# Patient Record
Sex: Female | Born: 1990 | Hispanic: No | Marital: Single | Smoking: Former smoker
Health system: Southern US, Community
[De-identification: ages and names within clinical notes are randomized; demographics above are authoritative.]

## PROBLEM LIST (undated history)

## (undated) ENCOUNTER — Inpatient Hospital Stay (HOSPITAL_COMMUNITY): Payer: Self-pay

## (undated) DIAGNOSIS — G932 Benign intracranial hypertension: Secondary | ICD-10-CM

## (undated) DIAGNOSIS — H4710 Unspecified papilledema: Secondary | ICD-10-CM

## (undated) DIAGNOSIS — F32A Depression, unspecified: Secondary | ICD-10-CM

## (undated) DIAGNOSIS — R519 Headache, unspecified: Secondary | ICD-10-CM

## (undated) DIAGNOSIS — R0600 Dyspnea, unspecified: Secondary | ICD-10-CM

## (undated) DIAGNOSIS — O24419 Gestational diabetes mellitus in pregnancy, unspecified control: Secondary | ICD-10-CM

## (undated) DIAGNOSIS — K5900 Constipation, unspecified: Secondary | ICD-10-CM

## (undated) DIAGNOSIS — R51 Headache: Secondary | ICD-10-CM

## (undated) HISTORY — DX: Unspecified papilledema: H47.10

## (undated) HISTORY — DX: Benign intracranial hypertension: G93.2

## (undated) HISTORY — PX: APPENDECTOMY: SHX54

## (undated) HISTORY — DX: Gestational diabetes mellitus in pregnancy, unspecified control: O24.419

---
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
## 2010-04-12 ENCOUNTER — Emergency Department (HOSPITAL_COMMUNITY)
Admission: EM | Admit: 2010-04-12 | Discharge: 2010-04-12 | Discharge status: Against Medical Advice | Payer: Self-pay | Attending: Emergency Medicine | Admitting: Emergency Medicine

## 2010-04-12 DIAGNOSIS — R21 Rash and other nonspecific skin eruption: Secondary | ICD-10-CM | POA: Insufficient documentation

## 2010-05-08 LAB — CBC
HCT: 28.6 % — ABNORMAL LOW (ref 36.0–46.0)
HCT: 31.4 % — ABNORMAL LOW (ref 36.0–46.0)
Hemoglobin: 10 g/dL — ABNORMAL LOW (ref 12.0–15.0)
Hemoglobin: 9.2 g/dL — ABNORMAL LOW (ref 12.0–15.0)
MCH: 23.3 pg — ABNORMAL LOW (ref 26.0–34.0)
MCH: 23.3 pg — ABNORMAL LOW (ref 26.0–34.0)
MCHC: 31.9 g/dL (ref 30.0–36.0)
MCHC: 32.1 g/dL (ref 30.0–36.0)
MCV: 72.7 fL — ABNORMAL LOW (ref 78.0–100.0)
MCV: 73 fL — ABNORMAL LOW (ref 78.0–100.0)
Platelets: 276 10*3/uL (ref 150–400)
Platelets: 304 10*3/uL (ref 150–400)
RBC: 3.94 MIL/uL (ref 3.87–5.11)
RBC: 4.3 MIL/uL (ref 3.87–5.11)
RDW: 16.1 % — ABNORMAL HIGH (ref 11.5–15.5)
RDW: 16.3 % — ABNORMAL HIGH (ref 11.5–15.5)
WBC: 12.9 10*3/uL — ABNORMAL HIGH (ref 4.0–10.5)
WBC: 16.1 10*3/uL — ABNORMAL HIGH (ref 4.0–10.5)

## 2010-05-08 LAB — RPR: RPR Ser Ql: NONREACTIVE

## 2010-05-20 LAB — URINALYSIS, ROUTINE W REFLEX MICROSCOPIC
Glucose, UA: NEGATIVE mg/dL
Protein, ur: 30 mg/dL — AB
pH: 7 (ref 5.0–8.0)

## 2010-05-20 LAB — URINE MICROSCOPIC-ADD ON

## 2010-05-29 LAB — URINE CULTURE: Colony Count: 45000

## 2010-05-29 LAB — DIFFERENTIAL
Eosinophils Relative: 2 % (ref 0–5)
Lymphocytes Relative: 26 % (ref 12–46)
Lymphs Abs: 1.9 10*3/uL (ref 0.7–4.0)
Monocytes Absolute: 0.4 10*3/uL (ref 0.1–1.0)
Neutro Abs: 4.8 10*3/uL (ref 1.7–7.7)

## 2010-05-29 LAB — URINALYSIS, ROUTINE W REFLEX MICROSCOPIC
Bilirubin Urine: NEGATIVE
Nitrite: NEGATIVE
Specific Gravity, Urine: 1.014 (ref 1.005–1.030)
pH: 6.5 (ref 5.0–8.0)

## 2010-05-29 LAB — CBC
MCHC: 31.4 g/dL (ref 30.0–36.0)
MCV: 72.3 fL — ABNORMAL LOW (ref 78.0–100.0)
Platelets: 270 10*3/uL (ref 150–400)
RDW: 17.6 % — ABNORMAL HIGH (ref 11.5–15.5)
WBC: 7.2 10*3/uL (ref 4.0–10.5)

## 2010-05-29 LAB — PREGNANCY, URINE: Preg Test, Ur: NEGATIVE

## 2010-05-29 LAB — LIPASE, BLOOD: Lipase: 22 U/L (ref 11–59)

## 2010-05-29 LAB — COMPREHENSIVE METABOLIC PANEL
AST: 20 U/L (ref 0–37)
Albumin: 3.7 g/dL (ref 3.5–5.2)
Calcium: 9 mg/dL (ref 8.4–10.5)
Creatinine, Ser: 0.76 mg/dL (ref 0.4–1.2)
GFR calc Af Amer: >60 mL/min (ref >60)

## 2010-06-03 LAB — CBC
Hemoglobin: 11.6 g/dL — ABNORMAL LOW (ref 12.0–15.0)
Hemoglobin: 9.1 g/dL — ABNORMAL LOW (ref 12.0–15.0)
MCHC: 33.4 g/dL (ref 30.0–36.0)
MCHC: 33.5 g/dL (ref 30.0–36.0)
MCHC: 33.8 g/dL (ref 30.0–36.0)
MCV: 81 fL (ref 78.0–100.0)
MCV: 81.7 fL (ref 78.0–100.0)
MCV: 81.7 fL (ref 78.0–100.0)
Platelets: 242 10*3/uL (ref 150–400)
RBC: 3.33 MIL/uL — ABNORMAL LOW (ref 3.87–5.11)
RBC: 4.47 MIL/uL (ref 3.87–5.11)
RDW: 14.1 % (ref 11.5–15.5)
RDW: 14.9 % (ref 11.5–15.5)
WBC: 14 10*3/uL — ABNORMAL HIGH (ref 4.0–10.5)

## 2010-06-03 LAB — COMPREHENSIVE METABOLIC PANEL
ALT: 14 U/L (ref 0–35)
ALT: 15 U/L (ref 0–35)
ALT: 15 U/L (ref 0–35)
AST: 22 U/L (ref 0–37)
AST: 26 U/L (ref 0–37)
Albumin: 2.2 g/dL — ABNORMAL LOW (ref 3.5–5.2)
Albumin: 2.6 g/dL — ABNORMAL LOW (ref 3.5–5.2)
Alkaline Phosphatase: 141 U/L — ABNORMAL HIGH (ref 39–117)
BUN: 5 mg/dL — ABNORMAL LOW (ref 6–23)
CO2: 21 mEq/L (ref 19–32)
CO2: 27 mEq/L (ref 19–32)
Calcium: 8.5 mg/dL (ref 8.4–10.5)
Calcium: 8.7 mg/dL (ref 8.4–10.5)
Chloride: 106 mEq/L (ref 96–112)
Chloride: 106 mEq/L (ref 96–112)
Creatinine, Ser: 0.66 mg/dL (ref 0.4–1.2)
Creatinine, Ser: 0.75 mg/dL (ref 0.4–1.2)
GFR calc Af Amer: >60 mL/min (ref >60)
GFR calc Af Amer: >60 mL/min (ref >60)
GFR calc non Af Amer: >60 mL/min (ref >60)
Glucose, Bld: 90 mg/dL (ref 70–99)
Glucose, Bld: 95 mg/dL (ref 70–99)
Potassium: 4.1 mEq/L (ref 3.5–5.1)
Sodium: 136 mEq/L (ref 135–145)
Sodium: 136 mEq/L (ref 135–145)
Total Bilirubin: 0.3 mg/dL (ref 0.3–1.2)
Total Bilirubin: 0.4 mg/dL (ref 0.3–1.2)
Total Protein: 6.2 g/dL (ref 6.0–8.3)

## 2010-06-03 LAB — RPR: RPR Ser Ql: NONREACTIVE

## 2010-06-03 LAB — LACTATE DEHYDROGENASE: LDH: 186 U/L (ref 94–250)

## 2010-06-04 LAB — LACTATE DEHYDROGENASE: LDH: 169 U/L (ref 94–250)

## 2010-06-04 LAB — COMPREHENSIVE METABOLIC PANEL
CO2: 24 mEq/L (ref 19–32)
Calcium: 8.9 mg/dL (ref 8.4–10.5)
Creatinine, Ser: 0.7 mg/dL (ref 0.4–1.2)
GFR calc Af Amer: >60 mL/min (ref >60)
GFR calc non Af Amer: >60 mL/min (ref >60)
Glucose, Bld: 73 mg/dL (ref 70–99)
Sodium: 136 mEq/L (ref 135–145)
Total Protein: 5.3 g/dL — ABNORMAL LOW (ref 6.0–8.3)

## 2010-06-04 LAB — CBC
Hemoglobin: 11.5 g/dL — ABNORMAL LOW (ref 12.0–15.0)
MCHC: 33.4 g/dL (ref 30.0–36.0)
RBC: 4.21 MIL/uL (ref 3.87–5.11)
RDW: 14.8 % (ref 11.5–15.5)

## 2010-06-04 LAB — URIC ACID: Uric Acid, Serum: 6.1 mg/dL (ref 2.4–7.0)

## 2010-07-09 ENCOUNTER — Emergency Department (HOSPITAL_COMMUNITY)
Admission: EM | Admit: 2010-07-09 | Discharge: 2010-07-09 | Disposition: A | Discharge status: Home / Self Care | Payer: Medicaid Other | Attending: Emergency Medicine | Admitting: Emergency Medicine

## 2010-07-09 DIAGNOSIS — N390 Urinary tract infection, site not specified: Secondary | ICD-10-CM | POA: Insufficient documentation

## 2010-07-09 DIAGNOSIS — R112 Nausea with vomiting, unspecified: Secondary | ICD-10-CM | POA: Insufficient documentation

## 2010-07-09 DIAGNOSIS — R109 Unspecified abdominal pain: Secondary | ICD-10-CM | POA: Insufficient documentation

## 2010-07-09 LAB — COMPREHENSIVE METABOLIC PANEL
ALT: 10 U/L (ref 0–35)
BUN: 8 mg/dL (ref 6–23)
CO2: 25 mEq/L (ref 19–32)
Calcium: 8.5 mg/dL (ref 8.4–10.5)
Creatinine, Ser: 0.75 mg/dL (ref 0.4–1.2)
GFR calc non Af Amer: >60 mL/min (ref >60)
Glucose, Bld: 103 mg/dL — ABNORMAL HIGH (ref 70–99)
Total Protein: 7.7 g/dL (ref 6.0–8.3)

## 2010-07-09 LAB — URINE MICROSCOPIC-ADD ON

## 2010-07-09 LAB — URINALYSIS, ROUTINE W REFLEX MICROSCOPIC
Glucose, UA: NEGATIVE mg/dL
Ketones, ur: NEGATIVE mg/dL
Nitrite: POSITIVE — AB
Protein, ur: 100 mg/dL — AB
pH: 6 (ref 5.0–8.0)

## 2010-07-09 LAB — DIFFERENTIAL
Eosinophils Relative: 0 % (ref 0–5)
Lymphs Abs: 2 10*3/uL (ref 0.7–4.0)
Monocytes Absolute: 1.5 10*3/uL — ABNORMAL HIGH (ref 0.1–1.0)
Monocytes Relative: 10 % (ref 3–12)
Neutro Abs: 11.1 10*3/uL — ABNORMAL HIGH (ref 1.7–7.7)

## 2010-07-09 LAB — CBC
HCT: 34.2 % — ABNORMAL LOW (ref 36.0–46.0)
Hemoglobin: 10.4 g/dL — ABNORMAL LOW (ref 12.0–15.0)
MCH: 21.6 pg — ABNORMAL LOW (ref 26.0–34.0)
MCHC: 30.4 g/dL (ref 30.0–36.0)
MCV: 71 fL — ABNORMAL LOW (ref 78.0–100.0)
RDW: 17.2 % — ABNORMAL HIGH (ref 11.5–15.5)

## 2010-07-09 LAB — LIPASE, BLOOD: Lipase: 17 U/L (ref 11–59)

## 2010-07-09 LAB — POCT PREGNANCY, URINE: Preg Test, Ur: NEGATIVE

## 2010-07-10 NOTE — Discharge Summary (Signed)
Jessica Massey, Jessica Massey NO.:  [id number]   MEDICAL RECORD NO.:  [id number]          PATIENT TYPE:  INP   LOCATION:  9113                          FACILITY:  [facility]   PHYSICIAN:  Zenaida Niece, M.D.DATE OF BIRTH:  1991-01-19   DATE OF ADMISSION:  09/08/2008  DATE OF DISCHARGE:  09/12/2008                               DISCHARGE SUMMARY   ADMISSION DIAGNOSIS:  Intrauterine pregnancy at 38+ weeks with  proteinuria and probable preeclampsia.   DISCHARGE DIAGNOSIS:  Intrauterine pregnancy at 38+ weeks with  proteinuria and probable preeclampsia.   PROCEDURE:  On September 10, 2008, she had a spontaneous vaginal delivery.   HISTORY AND PHYSICAL:  Please see chart for full history and physical.  Briefly, this is a 20 year old, gravida 1, para 0, with an EGA of 38+  weeks, who is admitted for proteinuria.  At her 37-week visit, she had 2  plus protein on a dipstick.  PIH labs were normal.  A 24-hour urine was  collected and returned with 2600 mg of protein.  Blood pressures have  remained normal.  This was discussed with Maternal Fetal Medicine by Dr.  Senaida Ores, and they elected to admit her for observation and deliver  her at 39 weeks.  The remainder of her history again is in her dictated  history and physical.  Physical exam is significant for obesity with  weight of 338 pounds.  Cervix is favorable with a vertex presentation.   HOSPITAL COURSE:  The patient was admitted and put on bedrest.  PIH labs  were normal.  On September 09, 2008, hospital day #2, blood pressures were  slightly elevated at 140 over 80-90.  PIH labs remained stable.  Early  on the morning of September 10, 2008, the patient complained of contractions  and possible rupture of membranes.  She was transferred to Labor and  Delivery where she rapidly progressed to complete and pushed well.  She  had IV penicillin running for group B strep at the time of delivery.  Blood pressures were 140s-150s over 80s-90s  while pushing.  She had a  vaginal delivery of a viable female infant with Apgars of 8 and 9, weight  of 7 pounds 12 ounces.  She had some small vaginal and labial  lacerations repaired with 3-0 Vicryl with local block.  Estimated blood  loss was 600 mL.  Blood pressure after delivery was 118/80.  She did not  receive magnesium sulfate.  Postpartum, she had no significant  complications.  Blood pressures remained normal.  Predelivery hemoglobin  11.5, postdelivery 9.1.  On postpartum #2, she was felt to be stable  enough for discharge home.   DISCHARGE INSTRUCTIONS:  Regular diet, pelvic rest, followup in 6 weeks.  Medications are over-the-counter ibuprofen as needed, and she is given  our discharge pamphlet.      Zenaida Niece, M.D.  Electronically Signed     TDM/MEDQ  D:  09/12/2008  T:  09/12/2008  Job:  [id number]

## 2010-07-10 NOTE — H&P (Signed)
NAME:  Jessica Massey, DEKAY NO.:  [id number]   MEDICAL RECORD NO.:  [id number]          PATIENT TYPE:  INP   LOCATION:  9149                          FACILITY:  [facility]   PHYSICIAN:  Huel Cote, M.D. DATE OF BIRTH:  August 21, 1990   DATE OF ADMISSION:  09/08/2008  DATE OF DISCHARGE:                              HISTORY & PHYSICAL   Patient is a 20 year old G1 P0 who is coming in at 38-3/[redacted] weeks  gestation with an ongoing finding of increasing proteinuria consistent  with preeclampsia.  On her 37 week visit the patient was noted to have  2+ proteinuria, which was confirmed by urine catheterization, therefore  she had pre-eclamptic labs performed which were normal and a 24-hour  urine performed.  The 24-hour urine returned abnormal with a total  protein of 2600 mg.  However, the patient's blood pressure and labs  remained normal.  Therefore, we continued with close observation.  On  the 15th, she presented to the office with an increase to 4+  proteinuria, her blood pressure was 135/80.  She had 3+ edema, otherwise  had no pre-eclamptic symptoms, no headaches or epigastric pain and  really felt overall pretty well.  She had good fetal movement and no  other complaints.  The patient was discussed with MSM which felt that  the proteinuria did not necessarily warrant delivery until 39 weeks,  however, she should be admitted inpatient for close observation of blood  pressures and labs.  Therefore, she is going to be admitted to the  antepartum unit with plan for daily PIH labs as well as a monitoring  with nonstress test daily.  Should she have any other abnormalities it  is recommended that the patient be delivered and if no other  abnormalities present certainly delivery at 39 weeks is acceptable.   PRENATAL CARE:  Significant for teen pregnancy.  She also has a history  of ADHD.   PRENATAL LABS:  Are as follows:  A+, antibody negative, RPR nonreactive,  rubella is  equivocal, needs postpartum vaccination, hepatitis B surface  antigen negative, HIV negative, GC negative, Chlamydia negative.  She is  positive for group B strep.  The patient had first trimester screening  which was normal and a normal AFP.  Her ultrasound was performed twice  to complete anatomy given her obesity and showed no abnormalities.  One-  hour Glucola was 121.   PAST OBSTETRICAL HISTORY:  None.   PAST GYN HISTORY:  None.   PAST MEDICAL HISTORY:  History of ADHD without medications required.   PAST SURGICAL HISTORY:  Appendectomy performed at age 51.   She has no known drug allergies.   Socially, the patient is a teenager.  She is obese, with a BMI of  greater than 40.  She did smoke prior to pregnancy, however has quit  with her pregnancy.   PHYSICAL EXAM:  Weight is 338 pounds, this is up 3 pounds from her visit  3 days ago.  Her blood pressure was 135/80.  CARDIAC:  Regular rate and rhythm.  LUNGS:  Clear.  ABDOMEN:  Obese and nontender.  Her edema is significant at 3+  bilaterally.  CERVICAL:  Is 54, -2 and vertex.   Again, this is a 20 year old patient with developing preeclampsia mostly  manifested by severe proteinuria and no abnormalities in labs thus far.  We will admit her to the antepartum unit for repeat daily laboratories  as well as blood pressure checks and should the patient develop any  symptoms or abnormal blood pressures proceed with induction of labor.  Should she remain stable until that point, she will be induced on July  19th at 39 weeks' gestation.      Huel Cote, M.D.  Electronically Signed     KR/MEDQ  D:  09/08/2008  T:  09/08/2008  Job:  [id number]

## 2010-12-27 ENCOUNTER — Emergency Department (HOSPITAL_COMMUNITY): Payer: Self-pay

## 2010-12-27 ENCOUNTER — Emergency Department (HOSPITAL_COMMUNITY)
Admission: EM | Admit: 2010-12-27 | Discharge: 2010-12-27 | Disposition: A | Discharge status: Home / Self Care | Payer: Self-pay | Attending: Emergency Medicine | Admitting: Emergency Medicine

## 2010-12-27 DIAGNOSIS — W268XXA Contact with other sharp object(s), not elsewhere classified, initial encounter: Secondary | ICD-10-CM | POA: Insufficient documentation

## 2010-12-27 DIAGNOSIS — W01119A Fall on same level from slipping, tripping and stumbling with subsequent striking against unspecified sharp object, initial encounter: Secondary | ICD-10-CM | POA: Insufficient documentation

## 2010-12-27 DIAGNOSIS — T148XXA Other injury of unspecified body region, initial encounter: Secondary | ICD-10-CM | POA: Insufficient documentation

## 2010-12-27 DIAGNOSIS — Y92009 Unspecified place in unspecified non-institutional (private) residence as the place of occurrence of the external cause: Secondary | ICD-10-CM | POA: Insufficient documentation

## 2010-12-30 ENCOUNTER — Encounter: Payer: Self-pay | Admitting: *Deleted

## 2010-12-30 ENCOUNTER — Emergency Department (HOSPITAL_COMMUNITY)
Admission: EM | Admit: 2010-12-30 | Discharge: 2010-12-30 | Disposition: A | Discharge status: Home / Self Care | Payer: Self-pay | Attending: Emergency Medicine | Admitting: Emergency Medicine

## 2010-12-30 DIAGNOSIS — W268XXA Contact with other sharp object(s), not elsewhere classified, initial encounter: Secondary | ICD-10-CM | POA: Insufficient documentation

## 2010-12-30 DIAGNOSIS — W01119A Fall on same level from slipping, tripping and stumbling with subsequent striking against unspecified sharp object, initial encounter: Secondary | ICD-10-CM | POA: Insufficient documentation

## 2010-12-30 DIAGNOSIS — IMO0001 Reserved for inherently not codable concepts without codable children: Secondary | ICD-10-CM

## 2010-12-30 DIAGNOSIS — S20229A Contusion of unspecified back wall of thorax, initial encounter: Secondary | ICD-10-CM | POA: Insufficient documentation

## 2010-12-30 DIAGNOSIS — Z4800 Encounter for change or removal of nonsurgical wound dressing: Secondary | ICD-10-CM | POA: Insufficient documentation

## 2010-12-30 NOTE — ED Provider Notes (Signed)
Medical screening examination/treatment/procedure(s) were conducted as a shared visit with non-physician practitioner(s) and myself.  I personally evaluated the patient during the encounter In patient with to the stapled surgical wounds on her back. No surrounding redness swelling or tenderness. No signs of infection  Doug Sou, MD 12/30/10 [phone]

## 2010-12-30 NOTE — ED Provider Notes (Signed)
History     CSN: [id number] Arrival date & time: 12/30/2010 11:18 AM   First MD Initiated Contact with Patient 12/30/10 1206      Chief Complaint  Patient presents with  . Wound Infection    (Consider location/radiation/quality/duration/timing/severity/associated sxs/prior treatment) HPI The patient is here for reevaluation of a wound in which she had staples placed last Thursday in November 1.  She was concerned that there may be infection involving the wound such want to be reassessed.  Patient has no fevers, and no drainage from the wound. History reviewed. No pertinent past medical history.  Past Surgical History  Procedure Date  . Appendectomy     No family history on file.  History  Substance Use Topics  . Smoking status: Current Everyday Smoker  . Smokeless tobacco: Not on file  . Alcohol Use: No    OB History    Grav Para Term Preterm Abortions TAB SAB Ect Mult Living                  Review of Systems  Constitutional: Negative for fever.  All other systems reviewed and are negative.    Allergies  Review of patient's allergies indicates no known allergies.  Home Medications   Current Outpatient Rx  Name Route Sig Dispense Refill  . HYDROCODONE-ACETAMINOPHEN 5-325 MG PO TABS Oral Take 1 tablet by mouth every 4 (four) hours as needed. For pain.       BP 109/63  Pulse 79  Temp(Src) 98.7 F (37.1 C) (Oral)  SpO2 99%  Physical Exam  Constitutional: She appears well-developed and well-nourished.  HENT:  Head: Normocephalic and atraumatic.  Eyes: Pupils are equal, round, and reactive to light.  Cardiovascular: Normal rate and regular rhythm.   Pulmonary/Chest: Effort normal and breath sounds normal.  Skin: Skin is warm and dry. Ecchymosis noted. No erythema.       ED Course  Procedures (including critical care time)  Labs Reviewed - No data to display No results found.   No diagnosis found.    MDM  Patient has no signs of infection  on exam, and Dr. Rennis Chris, also evaluated the patient agrees with this assessment.  The patient had sutures out at the assigned time.  Also, return here as needed        Carlyle Dolly, [place] 12/30/10 1259

## 2010-12-30 NOTE — ED Notes (Signed)
Pt fell through glass table on the 1st of Nov, now has redness mid lower  Staple line, upper back without s/s of infection

## 2011-01-06 ENCOUNTER — Emergency Department (HOSPITAL_COMMUNITY)
Admission: EM | Admit: 2011-01-06 | Discharge: 2011-01-06 | Disposition: A | Discharge status: Home / Self Care | Payer: Self-pay | Attending: Emergency Medicine | Admitting: Emergency Medicine

## 2011-01-06 ENCOUNTER — Encounter (HOSPITAL_COMMUNITY): Payer: Self-pay

## 2011-01-06 DIAGNOSIS — Z4802 Encounter for removal of sutures: Secondary | ICD-10-CM | POA: Insufficient documentation

## 2011-01-06 DIAGNOSIS — F17200 Nicotine dependence, unspecified, uncomplicated: Secondary | ICD-10-CM | POA: Insufficient documentation

## 2011-01-06 NOTE — ED Provider Notes (Signed)
History     CSN: [id number] Arrival date & time: 01/06/2011 10:40 AM   First MD Initiated Contact with Patient 01/06/11 1050      Chief Complaint  Patient presents with  . Suture / Staple Removal    was told last week to come in to get staples removed out of back/ follow-up tx    (Consider location/radiation/quality/duration/timing/severity/associated sxs/prior treatment) Patient is a 20 y.o. female presenting with suture removal. The history is provided by the patient.  Suture / Staple Removal   pt cut back accidentally on glass table on 11/1. Had xrays. Tetanus utd. States here for staple removal. No problems with wound. No fever. No purulent drainage.   History reviewed. No pertinent past medical history.  Past Surgical History  Procedure Date  . Appendectomy     No family history on file.  History  Substance Use Topics  . Smoking status: Current Everyday Smoker  . Smokeless tobacco: Not on file  . Alcohol Use: No    OB History    Grav Para Term Preterm Abortions TAB SAB Ect Mult Living                  Review of Systems  Constitutional: Negative for fever.  Gastrointestinal: Negative for nausea and vomiting.  Musculoskeletal: Negative for back pain.  Skin: Negative for rash.    Allergies  Review of patient's allergies indicates no known allergies.  Home Medications   Current Outpatient Rx  Name Route Sig Dispense Refill  . HYDROCODONE-ACETAMINOPHEN 5-325 MG PO TABS Oral Take 1 tablet by mouth every 4 (four) hours as needed. For pain.       BP 125/68  Pulse 81  Temp(Src) 98.2 F (36.8 C) (Oral)  Resp 20  SpO2 99%  LMP 12/31/2010  Physical Exam  Nursing note and vitals reviewed. Constitutional: She appears well-developed and well-nourished. No distress.  Eyes: Conjunctivae are normal. No scleral icterus.  Neck: Neck supple. No tracheal deviation present.  Cardiovascular: Normal rate.   Pulmonary/Chest: Effort normal. No respiratory distress.   Abdominal: Normal appearance. She exhibits no distension.  Musculoskeletal: She exhibits no edema.  Neurological: She is alert.  Skin: Skin is warm and dry. No rash noted.       Healing wounds to mid and lower back, without cellulitis, purulent drainage or other sign acute infection.  Psychiatric: She has a normal mood and affect.    ED Course  Procedures (including critical care time)  Labs Reviewed - No data to display No results found.   No diagnosis found.    MDM  Reviewed nursing note and chart. Nursing staff to remove staples and apply steri strips and dressing.        Suzi Roots, MD 01/06/11 1125

## 2011-01-10 ENCOUNTER — Encounter (HOSPITAL_COMMUNITY): Payer: Self-pay | Admitting: Emergency Medicine

## 2011-01-10 ENCOUNTER — Emergency Department (HOSPITAL_COMMUNITY)
Admission: EM | Admit: 2011-01-10 | Discharge: 2011-01-10 | Disposition: A | Discharge status: Home / Self Care | Payer: Medicaid Other | Attending: Emergency Medicine | Admitting: Emergency Medicine

## 2011-01-10 DIAGNOSIS — Z4802 Encounter for removal of sutures: Secondary | ICD-10-CM | POA: Insufficient documentation

## 2011-01-10 NOTE — ED Provider Notes (Signed)
History     CSN: [id number] Arrival date & time: 01/10/2011  5:06 PM   First MD Initiated Contact with Patient 01/10/11 1707      Chief Complaint  Patient presents with  . Suture / Staple Removal    Had most most her staples remained on 11/11 but missed one.    (Consider location/radiation/quality/duration/timing/severity/associated sxs/prior treatment) Patient is a 20 y.o. female presenting with suture removal. The history is provided by the patient.  Suture / Staple Removal  Treatments tried: The patient returned to ED after initial treatment for staple removal 01-06-11. There was one staple left in along with applied steristrips on that visit and she is here for final staple remove. There has been no drainage from the wound. There is no redness present. There is no swelling present. The pain has improved.    History reviewed. No pertinent past medical history.  Past Surgical History  Procedure Date  . Appendectomy     No family history on file.  History  Substance Use Topics  . Smoking status: Current Everyday Smoker  . Smokeless tobacco: Not on file  . Alcohol Use: No    OB History    Grav Para Term Preterm Abortions TAB SAB Ect Mult Living                  Review of Systems  Constitutional: Negative for fever and chills.  HENT: Negative.   Respiratory: Negative.   Cardiovascular: Negative.   Gastrointestinal: Negative.   Musculoskeletal: Negative.   Skin:       See HPI.  Neurological: Negative.     Allergies  Review of patient's allergies indicates no known allergies.  Home Medications   Current Outpatient Rx  Name Route Sig Dispense Refill  . ACETAMINOPHEN 325 MG PO TABS Oral Take 650 mg by mouth every 6 (six) hours as needed. For pain    . HYDROCORTISONE 1 % EX CREA Topical Apply 1 application topically 2 (two) times daily as needed. eczema    . IBUPROFEN 200 MG PO TABS Oral Take 400-600 mg by mouth every 6 (six) hours as needed. For pain        BP 117/81  Pulse 84  Temp 98.3 F (36.8 C)  Resp 14  SpO2 100%  LMP 12/31/2010  Physical Exam  Constitutional: She is oriented to person, place, and time. She appears well-developed and well-nourished.  Neck: Normal range of motion.  Pulmonary/Chest: Effort normal.  Neurological: She is alert and oriented to person, place, and time.  Skin: Skin is warm and dry.       Healing laceration to lower back with scab in place and one staple on right border. No redness, drainage. Mild tenderness.    ED Course  Procedures (including critical care time)  Labs Reviewed - No data to display No results found.   No diagnosis found.    MDM  One staple removed. Steristrips again placed for reinforcement of wound healing.        Rodena Medin, PA 01/10/11 [phone]

## 2011-01-11 NOTE — ED Provider Notes (Signed)
Medical screening examination/treatment/procedure(s) were performed by non-physician practitioner and as supervising physician I was immediately available for consultation/collaboration.   Lathaniel Legate, MD 01/11/11 0031 

## 2013-11-23 ENCOUNTER — Emergency Department (HOSPITAL_COMMUNITY)
Admission: EM | Admit: 2013-11-23 | Discharge: 2013-11-24 | Disposition: A | Discharge status: Home / Self Care | Payer: BC Managed Care – PPO | Attending: Emergency Medicine | Admitting: Emergency Medicine

## 2013-11-23 ENCOUNTER — Emergency Department (HOSPITAL_COMMUNITY): Payer: BC Managed Care – PPO

## 2013-11-23 ENCOUNTER — Encounter (HOSPITAL_COMMUNITY): Payer: Self-pay | Admitting: Emergency Medicine

## 2013-11-23 DIAGNOSIS — N898 Other specified noninflammatory disorders of vagina: Secondary | ICD-10-CM | POA: Insufficient documentation

## 2013-11-23 DIAGNOSIS — B9689 Other specified bacterial agents as the cause of diseases classified elsewhere: Secondary | ICD-10-CM | POA: Insufficient documentation

## 2013-11-23 DIAGNOSIS — F17200 Nicotine dependence, unspecified, uncomplicated: Secondary | ICD-10-CM | POA: Diagnosis not present

## 2013-11-23 DIAGNOSIS — A499 Bacterial infection, unspecified: Secondary | ICD-10-CM | POA: Diagnosis not present

## 2013-11-23 DIAGNOSIS — R0602 Shortness of breath: Secondary | ICD-10-CM | POA: Diagnosis not present

## 2013-11-23 DIAGNOSIS — N760 Acute vaginitis: Secondary | ICD-10-CM | POA: Diagnosis not present

## 2013-11-23 DIAGNOSIS — Z3202 Encounter for pregnancy test, result negative: Secondary | ICD-10-CM | POA: Diagnosis not present

## 2013-11-23 DIAGNOSIS — R209 Unspecified disturbances of skin sensation: Secondary | ICD-10-CM | POA: Insufficient documentation

## 2013-11-23 DIAGNOSIS — F959 Tic disorder, unspecified: Secondary | ICD-10-CM | POA: Insufficient documentation

## 2013-11-23 DIAGNOSIS — R4789 Other speech disturbances: Secondary | ICD-10-CM | POA: Diagnosis not present

## 2013-11-23 DIAGNOSIS — R4182 Altered mental status, unspecified: Secondary | ICD-10-CM | POA: Insufficient documentation

## 2013-11-23 LAB — URINALYSIS, ROUTINE W REFLEX MICROSCOPIC
Bilirubin Urine: NEGATIVE
Glucose, UA: NEGATIVE mg/dL
HGB URINE DIPSTICK: NEGATIVE
Ketones, ur: NEGATIVE mg/dL
Nitrite: NEGATIVE
PROTEIN: NEGATIVE mg/dL
SPECIFIC GRAVITY, URINE: 1.011 (ref 1.005–1.030)
Urobilinogen, UA: 0.2 mg/dL (ref 0.0–1.0)
pH: 8 (ref 5.0–8.0)

## 2013-11-23 LAB — COMPREHENSIVE METABOLIC PANEL
ALT: 15 U/L (ref 0–35)
ANION GAP: 14 (ref 5–15)
AST: 21 U/L (ref 0–37)
Albumin: 4.1 g/dL (ref 3.5–5.2)
Alkaline Phosphatase: 75 U/L (ref 39–117)
BUN: 15 mg/dL (ref 6–23)
CALCIUM: 9.2 mg/dL (ref 8.4–10.5)
CO2: 22 mEq/L (ref 19–32)
CREATININE: 0.66 mg/dL (ref 0.50–1.10)
Chloride: 102 mEq/L (ref 96–112)
GFR calc non Af Amer: >90 mL/min (ref >90)
GLUCOSE: 75 mg/dL (ref 70–99)
Potassium: 4.3 mEq/L (ref 3.7–5.3)
SODIUM: 138 meq/L (ref 137–147)
TOTAL PROTEIN: 8.2 g/dL (ref 6.0–8.3)
Total Bilirubin: 0.2 mg/dL — ABNORMAL LOW (ref 0.3–1.2)

## 2013-11-23 LAB — CBC WITH DIFFERENTIAL/PLATELET
Basophils Absolute: 0.1 10*3/uL (ref 0.0–0.1)
Basophils Relative: 0 % (ref 0–1)
EOS ABS: 0.1 10*3/uL (ref 0.0–0.7)
EOS PCT: 1 % (ref 0–5)
HEMATOCRIT: 43 % (ref 36.0–46.0)
Hemoglobin: 13.7 g/dL (ref 12.0–15.0)
LYMPHS ABS: 2.6 10*3/uL (ref 0.7–4.0)
Lymphocytes Relative: 20 % (ref 12–46)
MCH: 25 pg — AB (ref 26.0–34.0)
MCHC: 31.9 g/dL (ref 30.0–36.0)
MCV: 78.6 fL (ref 78.0–100.0)
MONO ABS: 1 10*3/uL (ref 0.1–1.0)
Monocytes Relative: 7 % (ref 3–12)
Neutro Abs: 9.6 10*3/uL — ABNORMAL HIGH (ref 1.7–7.7)
Neutrophils Relative %: 72 % (ref 43–77)
PLATELETS: 235 10*3/uL (ref 150–400)
RBC: 5.47 MIL/uL — AB (ref 3.87–5.11)
RDW: 15.1 % (ref 11.5–15.5)
WBC: 13.4 10*3/uL — ABNORMAL HIGH (ref 4.0–10.5)

## 2013-11-23 LAB — RAPID URINE DRUG SCREEN, HOSP PERFORMED
AMPHETAMINES: NOT DETECTED
BARBITURATES: NOT DETECTED
BENZODIAZEPINES: NOT DETECTED
Cocaine: NOT DETECTED
Opiates: NOT DETECTED
TETRAHYDROCANNABINOL: NOT DETECTED

## 2013-11-23 LAB — URINE MICROSCOPIC-ADD ON

## 2013-11-23 LAB — WET PREP, GENITAL
TRICH WET PREP: NONE SEEN
YEAST WET PREP: NONE SEEN

## 2013-11-23 LAB — PREGNANCY, URINE: Preg Test, Ur: NEGATIVE

## 2013-11-23 LAB — ETHANOL

## 2013-11-23 MED ORDER — LORAZEPAM 2 MG/ML IJ SOLN
1.0000 mg | Freq: Once | INTRAMUSCULAR | Status: AC
  Administered 2013-11-23: 1 mg via INTRAVENOUS
  Filled 2013-11-23: qty 1

## 2013-11-23 MED ORDER — METRONIDAZOLE 500 MG PO TABS: 500.0000 mg | ORAL_TABLET | Freq: Two times a day (BID) | ORAL | Status: DC

## 2013-11-23 NOTE — ED Notes (Signed)
Pt currently in MRI

## 2013-11-23 NOTE — ED Provider Notes (Signed)
CSN: [id number]     Arrival date & time 11/23/13  1433 History   First MD Initiated Contact with Patient 11/23/13 1505     Chief Complaint  Patient presents with  . Altered Mental Status   Patient is a 23 y.o. female presenting with altered mental status.  Altered Mental Status Associated symptoms: no abdominal pain, no fever, no headaches, no nausea, no palpitations, no vomiting and no weakness     Patient is a 23 y.o. Female who presents to the ED with abnormal movements of the face, left arm, and bilateral legs.   Patient states that she was feeling fine and was normal and around 10:30-11:00 am started developing these abnormal movements.  Patient denies a previous history of abnormal movements or any neurological conditions.  Patient denies taking any medications or drugs.  Patient was supposed to go to the health department today for vaginal discharge which has been going on for a week and she states is a thick orange yellow.  Patient denies fever, chills, nausea, vomiting, chest pain, abdominal pain, diarrhea, constipation, urinary urgency, urinary frequency, dysuria, hematuria, vaginal pain, vaginal bleeding, headache.  Patient does admit to some numbness of her left side.    History reviewed. No pertinent past medical history. Past Surgical History  Procedure Laterality Date  . Appendectomy     History reviewed. No pertinent family history. History  Substance Use Topics  . Smoking status: Current Every Day Smoker  . Smokeless tobacco: Not on file  . Alcohol Use: No   OB History   Grav Para Term Preterm Abortions TAB SAB Ect Mult Living                 Review of Systems  Constitutional: Negative for fever, chills and fatigue.  Respiratory: Positive for shortness of breath. Negative for chest tightness.   Cardiovascular: Negative for chest pain and palpitations.  Gastrointestinal: Negative for nausea, vomiting, abdominal pain, diarrhea, constipation and blood in stool.   Genitourinary: Positive for vaginal discharge. Negative for dysuria, urgency, frequency, hematuria, flank pain, vaginal bleeding, difficulty urinating, vaginal pain and pelvic pain.  Neurological: Positive for speech difficulty and numbness. Negative for dizziness, tremors, weakness and headaches.  All other systems reviewed and are negative.     Allergies  Review of patient's allergies indicates no known allergies.  Home Medications   Prior to Admission medications   Medication Sig Start Date End Date Taking? Authorizing Provider  metroNIDAZOLE (FLAGYL) 500 MG tablet Take 1 tablet (500 mg total) by mouth 2 (two) times daily. 11/23/13   Abdon Petrosky A Forcucci, PA-C   BP 113/76  Pulse 87  Temp(Src) 98.6 F (37 C) (Oral)  Resp 15  Ht 5\' 3"  (1.6 m)  Wt 299 lb 6.4 oz (135.807 kg)  BMI 53.05 kg/m2  SpO2 90%  LMP 10/30/2013 Physical Exam  Nursing note and vitals reviewed. Constitutional: She is oriented to person, place, and time. She appears well-developed and well-nourished. No distress.  Patient constantly moving legs  HENT:  Head: Normocephalic and atraumatic.  Mouth/Throat: Oropharynx is clear and moist. No oropharyngeal exudate.  Patient has repetitive facial grimacing and tongue rolling  Eyes: Conjunctivae and EOM are normal. Pupils are equal, round, and reactive to light. No scleral icterus.  Neck: Normal range of motion. Neck supple. No JVD present. No thyromegaly present.  Cardiovascular: Normal rate, normal heart sounds and intact distal pulses.  Exam reveals no gallop and no friction rub.   No murmur heard. Pulmonary/Chest: Effort  normal and breath sounds normal. No respiratory distress. She has no wheezes. She has no rales. She exhibits no tenderness.  Abdominal: Soft. Bowel sounds are normal. She exhibits no distension and no mass. There is no tenderness. There is no rebound and no guarding.  Genitourinary: Uterus normal. No labial fusion. There is no rash, tenderness,  lesion or injury on the right labia. There is no rash, tenderness, lesion or injury on the left labia. Uterus is not deviated, not enlarged, not fixed and not tender. Cervix exhibits no motion tenderness, no discharge and no friability. Right adnexum displays no mass, no tenderness and no fullness. Left adnexum displays no mass, no tenderness and no fullness. No erythema, tenderness or bleeding around the vagina. No foreign body around the vagina. No signs of injury around the vagina. Vaginal discharge found.  IUD strings visible, there is thin white vaginal discharge with foul smell.  Lymphadenopathy:    She has no cervical adenopathy.  Neurological: She is alert and oriented to person, place, and time. She has normal strength. A sensory deficit (Mild left arm and left facial sensory deficit to light touch reported.) is present. No cranial nerve deficit. Coordination and gait normal.  Skin: Skin is warm and dry. She is not diaphoretic.  Psychiatric: Judgment and thought content normal. Her affect is blunt. She is slowed. Cognition and memory are normal.  Speech is slowed but clear    ED Course  Procedures (including critical care time) Labs Review Labs Reviewed  WET PREP, GENITAL - Abnormal; Notable for the following:    Clue Cells Wet Prep HPF POC MANY (*)    WBC, Wet Prep HPF POC MANY (*)    All other components within normal limits  CBC WITH DIFFERENTIAL - Abnormal; Notable for the following:    WBC 13.4 (*)    RBC 5.47 (*)    MCH 25.0 (*)    Neutro Abs 9.6 (*)    All other components within normal limits  COMPREHENSIVE METABOLIC PANEL - Abnormal; Notable for the following:    Total Bilirubin 0.2 (*)    All other components within normal limits  URINALYSIS, ROUTINE W REFLEX MICROSCOPIC - Abnormal; Notable for the following:    APPearance TURBID (*)    Leukocytes, UA LARGE (*)    All other components within normal limits  URINE MICROSCOPIC-ADD ON - Abnormal; Notable for the  following:    Squamous Epithelial / LPF MANY (*)    Bacteria, UA FEW (*)    All other components within normal limits  GC/CHLAMYDIA PROBE AMP  URINE RAPID DRUG SCREEN (HOSP PERFORMED)  ETHANOL  PREGNANCY, URINE  HIV ANTIBODY (ROUTINE TESTING)    Imaging Review Ct Head Wo Contrast  11/23/2013   CLINICAL DATA:  Mental status change with speech difficulty  EXAM: CT HEAD WITHOUT CONTRAST  TECHNIQUE: Contiguous axial images were obtained from the base of the skull through the vertex without intravenous contrast.  COMPARISON:  None.  FINDINGS: The ventricles are normal in size and position. There is no intracranial hemorrhage nor intracranial mass effect. There is no evidence of acute ischemic change. There is subtle decreased density in the deep white matter of the posterior parietal lobes bilaterally slightly greater on the right than on the left but this is not clearly pathologic. The cerebellum and brainstem are normal.  The observed paranasal sinuses and mastoid air cells are clear.  IMPRESSION: There is no acute intracranial abnormality. If the patient's symptoms persist and remain unexplained,  brain MRI may be useful.   Electronically Signed   By: David  SwazilandJordan   On: 11/23/2013 17:23   Mr Brain Wo Contrast  11/23/2013   CLINICAL DATA:  Altered speech, altered mental status.  EXAM: MRI HEAD WITHOUT CONTRAST  TECHNIQUE: Multiplanar, multiecho pulse sequences of the brain and surrounding structures were obtained without intravenous contrast.  COMPARISON:  CT head November 23, 2013 at 1721 hr  FINDINGS: The ventricles and sulci are normal. No abnormal parenchymal signal, mass lesions or mass of affect. No reduced diffusion to suggest acute ischemia. No susceptibility artifact to suggest hemorrhage.  No abnormal extra-axial fluid collection. No abnormal calvarial bone marrow signal. Ocular globes and orbital contents are unremarkable though not tailored for evaluation. Trace paranasal sinus mucosal  thickening without air-fluid levels. The mastoid air cells are well aerated. No abnormal sellar expansion. Craniocervical junction is maintained. Cerebellar tonsils are at but not below the foramen magnum.  IMPRESSION: No acute intracranial process ; normal noncontrast MRI of the brain.   Electronically Signed   By: Awilda Metroourtnay  Bloomer   On: 11/23/2013 23:06     EKG Interpretation None      MDM   Final diagnoses:  Facial tic  Bacterial vaginosis   Patient is a 23 y.o female who presents to the ED with abnormal facial movements and left sided numbness.  Patient having repetitive facial grimacing and leg movements on examination.  There are no focal neurological deficits on exam.  There are some mild sensory deficits on the left side.  CBC reveals mild leukocytosis.  CMP is unremarkable.  UA is contaminated but does not show frank infection.   Wet prep reveals many WBCs and many clue cells.  UDS is negative.  EtOH is negative.  GC is pending and HIV are pending.  CT head is negative for acute abnormalities.  I spoke with Dr. Thad Rangereynolds from neurology at 5:30 and she recommended MRI of the brain.  MRI brain is negative for acute abnormalities.  Patient treated here with ativan with some relief of symptoms.  Unknown why patient has abnormal body movements.  Patient is stable for discharge at this time.  Will have the patient follow-up with a neurologist in the outpatient.  [facility], meningeal symptoms, and any other concerning symptoms.  Patient states understanding and agreement at this time.  Will discharge the patient home with flagyl 500 mg BID for BV.  Patient was seen her by Dr. Micheline Mazeocherty alongside me and she agrees with the above plan.         Eben Burowourtney A Forcucci, PA-C 11/23/13 2357

## 2013-11-23 NOTE — ED Notes (Signed)
Weight 299.4 lb

## 2013-11-23 NOTE — ED Notes (Signed)
Patient transported to MRI 

## 2013-11-23 NOTE — ED Notes (Signed)
Pt has returned from MRI. 

## 2013-11-23 NOTE — ED Notes (Signed)
Pt still currently in MRI, called MRI reported they are backed up and will get her back to ER as soon as possible. Family updated about delay.

## 2013-11-23 NOTE — ED Notes (Signed)
Gwendolyn GrantWalden, MD, made aware of patient.

## 2013-11-23 NOTE — ED Notes (Signed)
Per family, pt was at home and ate breakfast. She went to lie down afterwards.  Pt last normal is 10:30 am.  Pt has altered speech.  Gesturing with left arm.  No neuro history. Pt is gesturing with face.

## 2013-11-23 NOTE — ED Notes (Signed)
Pt provided with ham sandwich and sprite  

## 2013-11-23 NOTE — Discharge Instructions (Signed)
Bacterial Vaginosis Bacterial vaginosis is a vaginal infection that occurs when the normal balance of bacteria in the vagina is disrupted. It results from an overgrowth of certain bacteria. This is the most common vaginal infection in women of childbearing age. Treatment is important to prevent complications, especially in pregnant women, as it can cause a premature delivery. CAUSES  Bacterial vaginosis is caused by an increase in harmful bacteria that are normally present in smaller amounts in the vagina. Several different kinds of bacteria can cause bacterial vaginosis. However, the reason that the condition develops is not fully understood. RISK FACTORS Certain activities or behaviors can put you at an increased risk of developing bacterial vaginosis, including:  Having a new sex partner or multiple sex partners.  Douching.  Using an intrauterine device (IUD) for contraception. Women do not get bacterial vaginosis from toilet seats, bedding, swimming pools, or contact with objects around them. SIGNS AND SYMPTOMS  Some women with bacterial vaginosis have no signs or symptoms. Common symptoms include:  Grey vaginal discharge.  A fishlike odor with discharge, especially after sexual intercourse.  Itching or burning of the vagina and vulva.  Burning or pain with urination. DIAGNOSIS  Your health care provider will take a medical history and examine the vagina for signs of bacterial vaginosis. A sample of vaginal fluid may be taken. Your health care provider will look at this sample under a microscope to check for bacteria and abnormal cells. A vaginal pH test may also be done.  TREATMENT  Bacterial vaginosis may be treated with antibiotic medicines. These may be given in the form of a pill or a vaginal cream. A second round of antibiotics may be prescribed if the condition comes back after treatment.  HOME CARE INSTRUCTIONS   Only take over-the-counter or prescription medicines as  directed by your health care provider.  If antibiotic medicine was prescribed, take it as directed. Make sure you finish it even if you start to feel better.  Do not have sex until treatment is completed.  Tell all sexual partners that you have a vaginal infection. They should see their health care provider and be treated if they have problems, such as a mild rash or itching.  Practice safe sex by using condoms and only having one sex partner. SEEK MEDICAL CARE IF:   Your symptoms are not improving after 3 days of treatment.  You have increased discharge or pain.  You have a fever. MAKE SURE YOU:   Understand these instructions.  Will watch your condition.  Will get help right away if you are not doing well or get worse. FOR MORE INFORMATION  Centers for Disease Control and Prevention, Division of STD Prevention: www.cdc.gov/std American Sexual Health Association (ASHA): www.ashastd.org  Document Released: 02/11/2005 Document Revised: 12/02/2012 Document Reviewed: 09/23/2012 ExitCare Patient Information 2015 [employer]. This information is not intended to replace advice given to you by your health care provider. Make sure you discuss any questions you have with your health care provider.  

## 2013-11-23 NOTE — ED Notes (Signed)
Patient transported to CT 

## 2013-11-24 LAB — HIV ANTIBODY (ROUTINE TESTING W REFLEX): HIV 1&2 Ab, 4th Generation: NONREACTIVE

## 2013-11-24 LAB — GC/CHLAMYDIA PROBE AMP
CT Probe RNA: NEGATIVE
GC Probe RNA: NEGATIVE

## 2013-11-24 NOTE — ED Provider Notes (Signed)
Medical screening examination/treatment/procedure(s) were conducted as a shared visit with non-physician practitioner(s) and myself.  I personally evaluated the patient during the encounter. Pt presenting with report of AMS and abnml mvmts. On PE, VSS, pt in NAD, she does not appear confused, has not difficulty answering questions. She has intermittent frowning/grimacing of mouth which is distractible when she is conversing with me and also following my directions for full neuro exam which is otherwise benign for objective findings.  She reports subjective dec fine touch to entire left side of body.  Mvmts appear to be a motor tic. She uses no medications and would have no cause for development of  Tardive dyskinesia, furthermore, it appears suppressible.   Cardiopulm exam benign. CT head nml. PA Forcucci spoke w/ neurology who rec MRI which was also nml. I suspect psychiatric cause of symptoms. Will d/c home w/ rec for outpt neurology follow up.   EKG Interpretation None        Toy CookeyMegan Docherty, MD 11/24/13 1431

## 2013-11-25 ENCOUNTER — Ambulatory Visit (INDEPENDENT_AMBULATORY_CARE_PROVIDER_SITE_OTHER): Payer: Medicaid Other | Admitting: Neurology

## 2013-11-25 ENCOUNTER — Other Ambulatory Visit (INDEPENDENT_AMBULATORY_CARE_PROVIDER_SITE_OTHER): Payer: Self-pay

## 2013-11-25 ENCOUNTER — Encounter: Payer: Self-pay | Admitting: Neurology

## 2013-11-25 VITALS — BP 130/98 | HR 63 | Ht 63.0 in | Wt 302.0 lb

## 2013-11-25 DIAGNOSIS — R410 Disorientation, unspecified: Secondary | ICD-10-CM | POA: Insufficient documentation

## 2013-11-25 DIAGNOSIS — R400 Somnolence: Secondary | ICD-10-CM

## 2013-11-25 DIAGNOSIS — E669 Obesity, unspecified: Secondary | ICD-10-CM

## 2013-11-25 DIAGNOSIS — G5139 Clonic hemifacial spasm, unspecified: Secondary | ICD-10-CM | POA: Insufficient documentation

## 2013-11-25 DIAGNOSIS — G513 Clonic hemifacial spasm: Secondary | ICD-10-CM

## 2013-11-25 DIAGNOSIS — Z0289 Encounter for other administrative examinations: Secondary | ICD-10-CM

## 2013-11-25 NOTE — Procedures (Signed)
   HISTORY: 23 years old female, presenting with acute onset of confusion since November 23 2013.  TECHNIQUE:  16 channel EEG was performed based on standard 10-16 international system. One channel was dedicated to EKG, which has demonstrates normal sinus rhythm of 66 beats per minutes.  Upon awakening, the posterior background activity was well-developed, in alpha range, 9-10 Hz,reactive to eye opening and closure.  There was no evidence of epileptiform discharge.  Photic stimulation was performed, which induced a symmetric photic driving.  Hyperventilation was performed, there was no abnormality elicit.  No sleep was achieved.  CONCLUSION: This is a  normal awake EEG.  There is no electrodiagnostic evidence of epileptiform discharge

## 2013-11-25 NOTE — Progress Notes (Signed)
PATIENT: Jessica Massey DOB: 19-Feb-1991  HISTORICAL  Jessica Massey is a 23 years old right-handed female, accompanied by her mother, and the boyfriend for evaluation of acute onset confusion  She works among the September 28th 2015, at her normal status, went back home late, workup next morning November 23 2013, was able to help her children get ready for school, around 11:00 that day, she was noticed to be confused, staring, facial twitching, sleepiness,she was taken to the emergency room the same day, MRI of the brain without contrast was normal, laboratory showed elevated WBC, there was evidence of vaginal infection, she was given prescription of Flagyl  She continue to have excessive sleepiness, confusion, slurred speech over the past few days, but she is eating well, sleeping well, mother reported she has excessive stress,  She lives in a big household, with her 2 children, her siblings, a mother, her boyfriend   Today's EEG is normal,   REVIEW OF SYSTEMS: Full 14 system review of systems performed and notable only for headaches, numbness, slurred speech, lightheadedness, sleepiness, restless leg, chest pain, feeling hot, shortness of breath   ALLERGIES: No Known Allergies  HOME MEDICATIONS: Current Outpatient Prescriptions on File Prior to Visit  Medication Sig Dispense Refill  . metroNIDAZOLE (FLAGYL) 500 MG tablet Take 1 tablet (500 mg total) by mouth 2 (two) times daily.  14 tablet  0   No current facility-administered medications on file prior to visit.    PAST MEDICAL HISTORY: No past medical history on file.  PAST SURGICAL HISTORY: Past Surgical History  Procedure Laterality Date  . Appendectomy      FAMILY HISTORY: Family History  Problem Relation Age of Onset  . High blood pressure      SOCIAL HISTORY:  History   Social History  . Marital Status: Single    Spouse Name: N/A    Number of Children: 2  . Years of Education: 8th   Occupational  History  .  [employer]   Social History Main Topics  . Smoking status: Current Every Day Smoker  . Smokeless tobacco: Never Used  . Alcohol Use: No  . Drug Use: No  . Sexual Activity: Not on file   Other Topics Concern  . Not on file   Social History Narrative   Patient lives at home with her family.   Patient works at [employer].   Patient is right handed.   Caffeine soda daily.     PHYSICAL EXAM   Filed Vitals:   11/25/13 1059  BP: 130/98  Pulse: 63  Height: 5\' 3"  (1.6 m)  Weight: 302 lb (136.986 kg)    Not recorded    Body mass index is 53.51 kg/(m^2).   Generalized: In no acute distress  Neck: Supple, no carotid bruits   Cardiac: Regular rate rhythm  Pulmonary: Clear to auscultation bilaterally  Musculoskeletal: No deformity  Neurological examination  Mentation: variable effort during examination, looks very sleepy, mild drooling, oriented to her name, family's name, cooperative on examination   Cranial nerve II-XII: Pupils were equal round reactive to light. Extraocular movements were full.  Visual field were full on confrontational test. Bilateral fundi were sharp.  Facial sensation and strength were normal. Hearing was intact to finger rubbing bilaterally. Uvula tongue midline.  Head turning and shoulder shrug and were normal and symmetric.Tongue protrusion into cheek strength was normal.  Motor: Normal tone, bulk and strength.  Sensory: Intact to fine touch, pinprick, preserved vibratory  sensation, and proprioception at toes.  Coordination: Normal finger to nose, heel-to-shin bilaterally there was no truncal ataxia  Gait:  variable effort, left hand tends to stay in the left finger flexion, elbow pronation,   Romberg signs: Negative  Deep tendon reflexes: Brachioradialis 2/2, biceps 2/2, triceps 2/2, patellar 2/2, Achilles 2/2, plantar responses were flexor bilaterally.   DIAGNOSTIC DATA (LABS, IMAGING, TESTING) - I reviewed patient  records, labs, notes, testing and imaging myself where available.  Lab Results  Component Value Date   WBC 13.4* 11/23/2013   HGB 13.7 11/23/2013   HCT 43.0 11/23/2013   MCV 78.6 11/23/2013   PLT 235 11/23/2013      Component Value Date/Time   NA 138 11/23/2013 1517   K 4.3 11/23/2013 1517   CL 102 11/23/2013 1517   CO2 22 11/23/2013 1517   GLUCOSE 75 11/23/2013 1517   BUN 15 11/23/2013 1517   CREATININE 0.66 11/23/2013 1517   CALCIUM 9.2 11/23/2013 1517   PROT 8.2 11/23/2013 1517   ALBUMIN 4.1 11/23/2013 1517   AST 21 11/23/2013 1517   ALT 15 11/23/2013 1517   ALKPHOS 75 11/23/2013 1517   BILITOT 0.2* 11/23/2013 1517   GFRNONAA >90 11/23/2013 1517   GFRAA >90 11/23/2013 1517    ASSESSMENT AND PLAN  Jessica Massey is a 23 y.o. female with acute onset of confusion, essentially normal MRI of the brain without contrast, EEG,   differentiation diagnosis including mood disorder related, stress induced, Will continue observing her symptoms, return to clinic in 2weeks     Levert FeinsteinYijun Elvy Mclarty, M.D. Ph.D.  [facility] [address], [address] [place], [place] [address] [phone]

## 2013-11-26 ENCOUNTER — Telehealth: Payer: Self-pay | Admitting: Neurology

## 2013-11-26 MED ORDER — DIVALPROEX SODIUM ER 500 MG PO TB24: 500.0000 mg | ORAL_TABLET | Freq: Every day | ORAL | Status: DC

## 2013-11-26 NOTE — Telephone Encounter (Signed)
Patient's mother called questioning if medication(discussed at office visit) will be called into Pharmacy.  [facility] [address] [facility] [address]  Questioning if it'Is possible what she has could be Bells Palsy.  Please call and advise.

## 2013-11-26 NOTE — Telephone Encounter (Signed)
I called back.  They thought a new Rx for seizure med was going to be prescribed, but patient/mother are unsure of the name.  Says they would be taking one tablet at night.  She would like the Rx [facility].  As well, mom would like Dr Zannie CoveYan's opinion regarding the patient possibly having Bells' Palsy.  Please advise.  Thank you.

## 2013-11-26 NOTE — Telephone Encounter (Signed)
I have called her mother, I have prescribed Depakote ER 500 mg once every night to her pharmacy

## 2013-12-06 ENCOUNTER — Telehealth: Payer: Self-pay | Admitting: *Deleted

## 2013-12-06 NOTE — Telephone Encounter (Signed)
Patient calling in to schedule appointment with Dr Terrace ArabiaYan, states that Jessica Massey has been having a lot of headaches, appointment scheduled for 12/07/13 at 10 am with Dr Terrace ArabiaYan

## 2013-12-07 ENCOUNTER — Ambulatory Visit (INDEPENDENT_AMBULATORY_CARE_PROVIDER_SITE_OTHER): Payer: Self-pay | Admitting: Neurology

## 2013-12-07 ENCOUNTER — Encounter: Payer: Self-pay | Admitting: Neurology

## 2013-12-07 VITALS — BP 122/80 | HR 62 | Ht 63.0 in | Wt 306.0 lb

## 2013-12-07 DIAGNOSIS — G43909 Migraine, unspecified, not intractable, without status migrainosus: Secondary | ICD-10-CM | POA: Insufficient documentation

## 2013-12-07 DIAGNOSIS — G43009 Migraine without aura, not intractable, without status migrainosus: Secondary | ICD-10-CM

## 2013-12-07 MED ORDER — RIZATRIPTAN BENZOATE 10 MG PO TBDP: 10.0000 mg | ORAL_TABLET | ORAL | Status: DC | PRN

## 2013-12-07 NOTE — Progress Notes (Signed)
PATIENT: Jessica Massey Hust DOB: 07/10/1990  HISTORICAL  Jessica Massey Ravelo is a 23 years old right-handed female, accompanied by her mother, and the boyfriend for evaluation of acute onset confusion  She works among the September 28th 2015, at her normal status, went back home late, workup next morning November 23 2013, was able to help her children get ready for school, around 11:00 that day, she was noticed to be confused, staring, facial twitching, sleepiness,she was taken to the emergency room the same day, MRI of the brain without contrast was normal, laboratory showed elevated WBC, there was evidence of vaginal infection, she was given prescription of Flagyl  She continue to have excessive sleepiness, confusion, slurred speech over the past few days, but she is eating well, sleeping well, mother reported she has excessive stress,  She lives in a big household, with her 2 children, her siblings, a mother, her boyfriend   Today's EEG is normal,   UPDATE Dec 06 2013: She is doing better now, but she still has episodes at nighttime that she tense up, complaining of feeling hot hot, tired afterwards, she wants to go back to work, she is a Production designer, theatre/television/filmmanager at [employer].    She also complains of headache, twice in past one week, behind her eyes, spreading to left occipital, light noise sensivity, movement made it worse, lasting for a few hours   REVIEW OF SYSTEMS: Full 14 system review of systems performed and notable only for headaches, snoring, passing out, not enough sleep, constipation  ALLERGIES: No Known Allergies  HOME MEDICATIONS: Current Outpatient Prescriptions on File Prior to Visit  Medication Sig Dispense Refill  . divalproex (DEPAKOTE ER) 500 MG 24 hr tablet Take 1 tablet (500 mg total) by mouth at bedtime.  30 tablet  11  . metroNIDAZOLE (FLAGYL) 500 MG tablet Take 1 tablet (500 mg total) by mouth 2 (two) times daily.  14 tablet  0   No current facility-administered  medications on file prior to visit.    PAST MEDICAL HISTORY: History reviewed. No pertinent past medical history.  PAST SURGICAL HISTORY: Past Surgical History  Procedure Laterality Date  . Appendectomy      FAMILY HISTORY: Family History  Problem Relation Age of Onset  . High blood pressure      SOCIAL HISTORY:  History   Social History  . Marital Status: Single    Spouse Name: N/A    Number of Children: 2  . Years of Education: 8th   Occupational History  .  [employer]   Social History Main Topics  . Smoking status: Current Every Day Smoker  . Smokeless tobacco: Never Used  . Alcohol Use: No  . Drug Use: No  . Sexual Activity: Not on file   Other Topics Concern  . Not on file   Social History Narrative   Patient lives at home with her family.   Patient works at [employer].   Patient is right handed.   Caffeine soda daily.     PHYSICAL EXAM   Filed Vitals:   12/07/13 1005  BP: 122/80  Pulse: 62  Height: 5\' 3"  (1.6 Massey)  Weight: 306 lb (138.801 kg)    Not recorded    Body mass index is 54.22 kg/(Massey^2).   Generalized: In no acute distress  Neck: Supple, no carotid bruits   Cardiac: Regular rate rhythm  Pulmonary: Clear to auscultation bilaterally  Musculoskeletal: No deformity  Neurological examination  Mentation: Tired looking, obese, young  female,   Cranial nerve II-XII: Pupils were equal round reactive to light. Extraocular movements were full.  Visual field were full on confrontational test. Bilateral fundi were sharp.  Facial sensation and strength were normal. Hearing was intact to finger rubbing bilaterally. Uvula tongue midline.  Head turning and shoulder shrug and were normal and symmetric.Tongue protrusion into cheek strength was normal.  Motor: Normal tone, bulk and strength.  Sensory: Intact to fine touch, pinprick, preserved vibratory sensation, and proprioception at toes.  Coordination: Normal finger to nose,  heel-to-shin bilaterally there was no truncal ataxia  Gait:  Narrow based and steady  Romberg signs: Negative  Deep tendon reflexes: Brachioradialis 2/2, biceps 2/2, triceps 2/2, patellar 2/2, Achilles 2/2, plantar responses were flexor bilaterally.   DIAGNOSTIC DATA (LABS, IMAGING, TESTING) - I reviewed patient records, labs, notes, testing and imaging myself where available.  Lab Results  Component Value Date   WBC 13.4* 11/23/2013   HGB 13.7 11/23/2013   HCT 43.0 11/23/2013   MCV 78.6 11/23/2013   PLT 235 11/23/2013      Component Value Date/Time   NA 138 11/23/2013 1517   K 4.3 11/23/2013 1517   CL 102 11/23/2013 1517   CO2 22 11/23/2013 1517   GLUCOSE 75 11/23/2013 1517   BUN 15 11/23/2013 1517   CREATININE 0.66 11/23/2013 1517   CALCIUM 9.2 11/23/2013 1517   PROT 8.2 11/23/2013 1517   ALBUMIN 4.1 11/23/2013 1517   AST 21 11/23/2013 1517   ALT 15 11/23/2013 1517   ALKPHOS 75 11/23/2013 1517   BILITOT 0.2* 11/23/2013 1517   GFRNONAA >90 11/23/2013 1517   GFRAA >90 11/23/2013 1517    ASSESSMENT AND PLAN  Jessica Massey Paola is a 23 y.o. female with acute onset of confusion, essentially normal MRI of the brain without contrast, EEG,  She overall has improved, tolerating Depakote well, she reported history of headaches consistent with migraine, Maxalt as needed  Return to clinic in 3 months with nurse practitioner     Levert FeinsteinYijun Cornelius Schuitema, Massey.D. Ph.D.  [facility] [address], [address] [place], [place] [address] [phone]

## 2013-12-09 ENCOUNTER — Ambulatory Visit: Payer: Self-pay | Admitting: Neurology

## 2014-03-09 ENCOUNTER — Ambulatory Visit: Payer: Self-pay | Admitting: Adult Health

## 2015-07-15 ENCOUNTER — Emergency Department (HOSPITAL_COMMUNITY)
Admission: EM | Admit: 2015-07-15 | Discharge: 2015-07-16 | Disposition: A | Discharge status: Home / Self Care | Payer: Medicaid Other | Attending: Emergency Medicine | Admitting: Emergency Medicine

## 2015-07-15 ENCOUNTER — Encounter (HOSPITAL_COMMUNITY): Payer: Self-pay | Admitting: Nurse Practitioner

## 2015-07-15 DIAGNOSIS — G4489 Other headache syndrome: Secondary | ICD-10-CM

## 2015-07-15 DIAGNOSIS — F17210 Nicotine dependence, cigarettes, uncomplicated: Secondary | ICD-10-CM | POA: Diagnosis not present

## 2015-07-15 DIAGNOSIS — G43909 Migraine, unspecified, not intractable, without status migrainosus: Secondary | ICD-10-CM | POA: Diagnosis present

## 2015-07-15 NOTE — ED Notes (Signed)
Pt reportedly had 3 seizures yesterday but presents for evalaution for severe headaches. Of note she has not been diagnosed with seizure disorders, was only able to see a neurologist once and does not recall the seizure medications she got Rx'd.

## 2015-07-16 MED ORDER — METOCLOPRAMIDE HCL 5 MG/ML IJ SOLN
10.0000 mg | Freq: Once | INTRAMUSCULAR | Status: AC
  Administered 2015-07-16: 10 mg via INTRAVENOUS
  Filled 2015-07-16: qty 2

## 2015-07-16 MED ORDER — DIPHENHYDRAMINE HCL 50 MG/ML IJ SOLN
25.0000 mg | Freq: Once | INTRAMUSCULAR | Status: AC
  Administered 2015-07-16: 25 mg via INTRAVENOUS
  Filled 2015-07-16: qty 1

## 2015-07-16 MED ORDER — KETOROLAC TROMETHAMINE 30 MG/ML IJ SOLN
30.0000 mg | Freq: Once | INTRAMUSCULAR | Status: AC
Start: 2015-07-16 — End: 2015-07-16
  Administered 2015-07-16: 30 mg via INTRAVENOUS
  Filled 2015-07-16: qty 1

## 2015-07-16 NOTE — Discharge Instructions (Signed)

## 2015-07-16 NOTE — ED Provider Notes (Signed)
CSN: [id number]     Arrival date & time 07/15/15  2015 History   By signing my name below, I, Evon Slack, attest that this documentation has been prepared under the direction and in the presence of Zadie Rhine, MD. Electronically Signed: Evon Slack, ED Scribe. 07/16/2015. 1:07 AM.    Chief Complaint  Patient presents with  . Migraine    Patient is a 25 y.o. female presenting with migraines. The history is provided by the patient. No language interpreter was used.  Migraine Associated symptoms include headaches.   HPI Comments: MERSADIE KAVANAUGH is a 24 y.o. female who presents to the Emergency Department complaining of severe intermittent posterior and frontal HA onset 3 weeks prior. Pt reports that sometime she has seizures brought on from the pain of  Her HA. Pt reports having 3 seizures 1 day prior. Pt denies being on any seizure medication. Pt reports she normally has relief of her HA's with tylenol but not this headache. Denies any worsening factors. Denies fever, vomiting, vision changes numbness or weakness.  She has had these issues in the past and has had extensive workup for her seizures.   PMH - headache, seizures Past Surgical History  Procedure Laterality Date  . Appendectomy     Family History  Problem Relation Age of Onset  . High blood pressure     Social History  Substance Use Topics  . Smoking status: Current Every Day Smoker    Types: Cigarettes  . Smokeless tobacco: Never Used  . Alcohol Use: Yes   OB History    No data available      Review of Systems  Constitutional: Negative for fever.  Eyes: Negative for visual disturbance.  Gastrointestinal: Negative for vomiting.  Neurological: Positive for seizures and headaches. Negative for weakness and numbness.  All other systems reviewed and are negative.     Allergies  Review of patient's allergies indicates no known allergies.  Home Medications   Prior to Admission medications    Medication Sig Start Date End Date Taking? Authorizing Provider  acetaminophen (TYLENOL) 500 MG tablet Take 1,500 mg by mouth every 6 (six) hours as needed for mild pain, moderate pain or headache.   Yes Historical Provider, MD  verapamil (CALAN) 120 MG tablet Take 120 mg by mouth once.   Yes Historical Provider, MD  divalproex (DEPAKOTE ER) 500 MG 24 hr tablet Take 1 tablet (500 mg total) by mouth at bedtime. Patient not taking: Reported on 07/15/2015 11/26/13   Levert Feinstein, MD   BP 133/77 mmHg  Pulse 91  Temp(Src) 98.6 F (37 C) (Oral)  Resp 18  Ht  (1.6 m)  Wt 304 lb 3.4 oz (137.989 kg)  BMI 53.90 kg/m2  SpO2 95%  LMP 05/15/2015 (Approximate)   Physical Exam  CONSTITUTIONAL: Well developed/well nourished HEAD: Normocephalic/atraumatic EYES: EOMI/PERRL, no nystagmus, no ptosis, normal fundoscopic exam (no papilledema)  ENMT: Mucous membranes moist NECK: supple no meningeal signs, no bruits SPINE/BACK:entire spine nontender CV: S1/S2 noted, no murmurs/rubs/gallops noted LUNGS: Lungs are clear to auscultation bilaterally, no apparent distress ABDOMEN: soft, nontender, no rebound or guarding GU:no cva tenderness NEURO:Awake/alert, face symmetric, no arm or leg drift is noted Equal 5/5 strength with shoulder abduction, elbow flex/extension, wrist flex/extension in upper extremities and equal hand grips bilaterally Equal 5/5 strength with hip flexion,knee flex/extension, foot dorsi/plantar flexion Cranial nerves 3/4/5/6/09/02/08/11/12 tested and intact Gait normal without ataxia No past pointing Sensation to light touch intact in all extremities EXTREMITIES: pulses  normal, full ROM SKIN: warm, color normal PSYCH: no abnormalities of mood noted, alert and oriented to situation   ED Course  Procedures DIAGNOSTIC STUDIES: Oxygen Saturation is 95% on RA, adequate by my interpretation.    COORDINATION OF CARE: 1:06 AM-Discussed treatment plan with pt at bedside and pt agreed  to plan.   Pt improved with meds She is nontoxic appearing She has HA frequently but worse today No seizures in the ED Will d/c home with neuro f/u Discussed return precautions with mother/patient   Medications  metoCLOPramide (REGLAN) injection 10 mg (10 mg Intravenous Given 07/16/15 0154)  diphenhydrAMINE (BENADRYL) injection 25 mg (25 mg Intravenous Given 07/16/15 0159)  ketorolac (TORADOL) 30 MG/ML injection 30 mg (30 mg Intravenous Given 07/16/15 0157)     MDM   Final diagnoses:  Other headache syndrome      Nursing notes including past medical history and social history reviewed and considered in documentation  I personally performed the services described in this documentation, which was scribed in my presence. The recorded information has been reviewed and is accurate.         Zadie Rhineonald Neyah Ellerman, MD 07/16/15 [phone]

## 2015-09-22 ENCOUNTER — Ambulatory Visit (HOSPITAL_BASED_OUTPATIENT_CLINIC_OR_DEPARTMENT_OTHER): Payer: Medicaid Other

## 2015-10-06 ENCOUNTER — Ambulatory Visit (HOSPITAL_BASED_OUTPATIENT_CLINIC_OR_DEPARTMENT_OTHER): Payer: Medicaid Other | Attending: Family Medicine | Admitting: Internal Medicine

## 2015-10-06 VITALS — Ht 62.0 in | Wt 315.0 lb

## 2015-10-06 DIAGNOSIS — E669 Obesity, unspecified: Secondary | ICD-10-CM | POA: Insufficient documentation

## 2015-10-06 DIAGNOSIS — Z6841 Body Mass Index (BMI) 40.0 and over, adult: Secondary | ICD-10-CM | POA: Insufficient documentation

## 2015-10-06 DIAGNOSIS — R51 Headache: Secondary | ICD-10-CM | POA: Diagnosis not present

## 2015-10-06 DIAGNOSIS — R5383 Other fatigue: Secondary | ICD-10-CM | POA: Diagnosis present

## 2015-10-06 DIAGNOSIS — R519 Headache, unspecified: Secondary | ICD-10-CM

## 2015-10-06 DIAGNOSIS — R0683 Snoring: Secondary | ICD-10-CM | POA: Diagnosis not present

## 2015-10-26 NOTE — Procedures (Signed)
   Patient Name: Jessica Massey, Jessica Massey   Study Date: 10/06/2015   Gender: Female  D.O.B: 1990/05/01  Age (years): 25  Referring Provider: Leilani AbleBetti Reese   Height (inches): 62  Interpreting Physician: Jetty Duhamellinton Kailly Richoux MD, ABSM  Weight (lbs): 315  RPSGT: Elaina Patteeeeriemer, Holly   BMI: 58  MRN: [id number]  Neck Size: 17.00    CLINICAL INFORMATION  Sleep Study Type: NPSG  Indication for sleep study: Fatigue, Obesity, Snoring  Epworth Sleepiness Score: 13  SLEEP STUDY TECHNIQUE  As per the AASM Manual for the Scoring of Sleep and Associated Events v2.3 (April 2016) with a hypopnea requiring 4% desaturations.  The channels recorded and monitored were frontal, central and occipital EEG, electrooculogram (EOG), submentalis EMG (chin), nasal and oral airflow, thoracic and abdominal wall motion, anterior tibialis EMG, snore microphone, electrocardiogram, and pulse oximetry.  MEDICATIONS  Patient's medications include: charted for review. Medications self-administered by patient during sleep study : No sleep medicine administered. SLEEP ARCHITECTURE  The study was initiated at 9:32:19 PM and ended at 4:31:52 AM.  Sleep onset time was 29.1 minutes and the sleep efficiency was 83.8%. The total sleep time was 351.4 minutes.  Stage REM latency was 139.5 minutes.  The patient spent 15.93% of the night in stage N1 sleep, 66.71% in stage N2 sleep, 13.80% in stage N3 and 3.56% in REM.  Alpha intrusion was absent.  Supine sleep was 27.32%.  RESPIRATORY PARAMETERS  The overall apnea/hypopnea index (AHI) was 1.4 per hour. There were 1 total apneas, including 1 obstructive, 0 central and 0 mixed apneas. There were 7 hypopneas and 18 RERAs.  The AHI during Stage REM sleep was 14.4 per hour. AHI while supine was 1.9 per hour.  The mean oxygen saturation was 92.66%. The minimum SpO2 during sleep was 83.00%.  Moderate snoring was noted during this study. CARDIAC DATA  The 2 lead EKG demonstrated sinus rhythm. The mean  heart rate was 74.92 beats per minute. Other EKG findings include: None.  LEG MOVEMENT DATA  The total PLMS were 0 with a resulting PLMS index of 0.00. Associated arousal with leg movement index was 0.0 . IMPRESSIONS  - No significant obstructive sleep apnea occurred during this study (AHI = 1.4/h).  - No significant central sleep apnea occurred during this study (CAI = 0.0/h).  - Mild oxygen desaturation was noted during this study (Min O2 = 83.00%).  - The patient snored with Moderate snoring volume.  - No cardiac abnormalities were noted during this study.  - Clinically significant periodic limb movements did not occur during sleep. No significant associated arousals. DIAGNOSIS  - Primary Snoring (786.09 [R06.83 ICD-10]) RECOMMENDATIONS  - Avoid alcohol, sedatives and other CNS depressants that may worsen sleep apnea and disrupt normal sleep architecture. - Sleep hygiene should be reviewed to assess factors that may improve sleep quality. - Weight management and regular exercise should be initiated or continued if appropriate. [Electronically signed] 10/26/2015 02:34 PM Jetty Duhamellinton Atiyah Bauer MD, ABSM  Diplomate, American Board of Sleep Medicine  NPI: [id number]      Waymon BudgeYOUNG,Mykel Mohl D Diplomate, American Board of Sleep Medicine  ELECTRONICALLY SIGNED ON:  10/26/2015, 2:35 PM [facility] PH: [phone]   FX: [phone] ACCREDITED BY THE AMERICAN ACADEMY OF SLEEP MEDICINE

## 2015-10-27 ENCOUNTER — Encounter (HOSPITAL_BASED_OUTPATIENT_CLINIC_OR_DEPARTMENT_OTHER): Payer: Medicaid Other

## 2015-12-21 ENCOUNTER — Ambulatory Visit (INDEPENDENT_AMBULATORY_CARE_PROVIDER_SITE_OTHER): Payer: Medicaid Other | Admitting: Neurology

## 2015-12-21 ENCOUNTER — Encounter: Payer: Self-pay | Admitting: Neurology

## 2015-12-21 VITALS — BP 119/73 | HR 97 | Ht 62.0 in | Wt 324.5 lb

## 2015-12-21 DIAGNOSIS — G43009 Migraine without aura, not intractable, without status migrainosus: Secondary | ICD-10-CM | POA: Diagnosis not present

## 2015-12-21 DIAGNOSIS — H4710 Unspecified papilledema: Secondary | ICD-10-CM

## 2015-12-21 DIAGNOSIS — R410 Disorientation, unspecified: Secondary | ICD-10-CM | POA: Diagnosis not present

## 2015-12-21 DIAGNOSIS — E669 Obesity, unspecified: Secondary | ICD-10-CM | POA: Diagnosis not present

## 2015-12-21 MED ORDER — TOPIRAMATE 100 MG PO TABS: 100.0000 mg | ORAL_TABLET | Freq: Two times a day (BID) | ORAL | 11 refills | Status: DC

## 2015-12-21 MED ORDER — SUMATRIPTAN SUCCINATE 100 MG PO TABS
100.0000 mg | ORAL_TABLET | ORAL | 11 refills | Status: DC | PRN
Start: 2015-12-21 — End: 2017-02-27

## 2015-12-21 NOTE — Progress Notes (Signed)
PATIENT: Jessica Massey DOB: 1990-11-05  Chief Complaint  Patient presents with  . Headache    Reports having 2-3 headaches per week. She is here, after having an abnormal eye exam, to have her bilateral disc edema further evaluated.  Marland Kitchen PCP    Jessica Able, MD  . Optometrist    Jessica Massey, OD     HISTORICAL   Jessica Massey to 25 years old right-handed female, seen in refer by her optometrist Jessica Massey for evaluation of bilateral papillary edema, her primary care physician is Jessica Grippe, MD, evaluation was on December 21 2015.  She reported a history of migraine headache in the past, getting worse since January 2017, 2-3 times each week, follow cranial severe pounding headache with associated light noise sensitivity, at the same time noticed blurry vision, especially with sudden positional change took a while to focus, hazy sensation,  She reported 50 pound weight gain over past 12 months,  For her migraine headache she has tried over-the-counter Tylenol, ibuprofen without benefit,  I reviewed and summarized optometry report on December 01 2015, visual acuity with correction OD 20/50, OS 20/60, intraocular pressure 11, evidence of bilateral papillary edema,  I saw her previously in 2015 were confusion episode, I have personally reviewed MRI of the brain 2015 that was normal, EEG was normal in 2015,   REVIEW OF SYSTEMS: Full 14 system review of systems performed and notable only for weight gain, blurry vision, eye pain, headaches, seizure, depression  ALLERGIES: No Known Allergies  HOME MEDICATIONS: Current Outpatient Prescriptions  Medication Sig Dispense Refill  . acetaminophen (TYLENOL) 500 MG tablet Take 1,500 mg by mouth every 6 (six) hours as needed for mild pain, moderate pain or headache.    . Multiple Vitamin (MULTIVITAMIN) capsule Take 1 capsule by mouth daily.     No current facility-administered medications for this visit.     PAST MEDICAL  HISTORY: Past Medical History:  Diagnosis Date  . Optic disc edema     PAST SURGICAL HISTORY: Past Surgical History:  Procedure Laterality Date  . APPENDECTOMY      FAMILY HISTORY: Family History  Problem Relation Age of Onset  . Hypertension Mother   . Diabetes Mother   . Aneurysm Father   . High blood pressure    . Breast cancer Maternal Grandmother     SOCIAL HISTORY:  Social History   Social History  . Marital status: Single    Spouse name: N/A  . Number of children: 2  . Years of education: 8th   Occupational History  .  [employer]   Social History Main Topics  . Smoking status: Current Every Day Smoker    Packs/day: 0.25    Types: Cigarettes  . Smokeless tobacco: Never Used  . Alcohol use Yes     Comment: Occasional use  . Drug use: No  . Sexual activity: Not on file   Other Topics Concern  . Not on file   Social History Narrative   Patient lives at home with her family.   Patient works at [employer].   Patient is right handed.   Caffeine - 32oz per day.     PHYSICAL EXAM   Vitals:   12/21/15 1431  BP: 119/73  Pulse: 97  Weight: (!) 324 lb 8 oz (147.2 kg)  Height: 5\' 2"  (1.575 m)    Not recorded      Body mass index is 59.35 kg/m.  PHYSICAL EXAMNIATION:  Gen: NAD, conversant, well nourised, obese, well groomed                     Cardiovascular: Regular rate rhythm, no peripheral edema, warm, nontender. Eyes: Conjunctivae clear without exudates or hemorrhage Neck: Supple, no carotid bruits. Pulmonary: Clear to auscultation bilaterally   NEUROLOGICAL EXAM:  MENTAL STATUS: Speech:    Speech is normal; fluent and spontaneous with normal comprehension.  Cognition:     Orientation to time, place and person     Normal recent and remote memory     Normal Attention span and concentration     Normal Language, naming, repeating,spontaneous speech     Fund of knowledge   CRANIAL NERVES: CN II: Visual fields are full to  confrontation. Fundoscopic exam is normal with sharp discs and no vascular changes. Pupils are round equal and briskly reactive to light. CN III, IV, VI: extraocular movement are normal. No ptosis. CN V: Facial sensation is intact to pinprick in all 3 divisions bilaterally. Corneal responses are intact.  CN VII: Face is symmetric with normal eye closure and smile. CN VIII: Hearing is normal to rubbing fingers CN IX, X: Palate elevates symmetrically. Phonation is normal. CN XI: Head turning and shoulder shrug are intact CN XII: Tongue is midline with normal movements and no atrophy.  MOTOR: There is no pronator drift of out-stretched arms. Muscle bulk and tone are normal. Muscle strength is normal.  REFLEXES: Reflexes are 2+ and symmetric at the biceps, triceps, knees, and ankles. Plantar responses are flexor.  SENSORY: Intact to light touch, pinprick, positional sensation and vibratory sensation are intact in fingers and toes.  COORDINATION: Rapid alternating movements and fine finger movements are intact. There is no dysmetria on finger-to-nose and heel-knee-shin.    GAIT/STANCE: Posture is normal. Gait is steady with normal steps, base, arm swing, and turning. Heel and toe walking are normal. Tandem gait is normal.  Romberg is absent.   DIAGNOSTIC DATA (LABS, IMAGING, TESTING) - I reviewed patient records, labs, notes, testing and imaging myself where available.   ASSESSMENT AND PLAN  Jessica Massey is a 25 y.o. female   Pseudotumor cerebri She had rapid weight gain of 50 pounds over past 1 year I have suggested her moderate exercise, weight loss, diet MRI of brain Lumbar puncture check open pressure Start Topamax 100 mg twice a day  Worsening chronic migraine headaches Imitrex 100 mg as needed   Levert FeinsteinYijun Watt Geiler, M.D. Ph.D.  [facility] [address], [address] [place], [place] [address] Ph: [phone] Fax: [phone]  CC: Referring Provider

## 2015-12-26 ENCOUNTER — Telehealth: Payer: Self-pay | Admitting: Neurology

## 2015-12-26 ENCOUNTER — Encounter: Payer: Self-pay | Admitting: *Deleted

## 2015-12-26 NOTE — Telephone Encounter (Signed)
Per Dr. Terrace ArabiaYan, reduce dose of Topamax to 100mg , one tablet QHS.

## 2015-12-26 NOTE — Telephone Encounter (Signed)
Pt's mother called to advise the new medication is making her giggly, she can't focus, longer to process thoughts. She is concerned for her to drive while taking this medication-she drives fast and is weaving in her lane. The mother does not know the name of the medication. She advised the pt will be getting out of school at 1pm to day, please call her at [phone].

## 2015-12-26 NOTE — Telephone Encounter (Signed)
Spoke to [name] and her mother - they are aware to reduce Topamax to 100mg , qhs.  Instructed her not to drive until her reported side effects have completely resolved.  They both verbalized understanding.

## 2016-01-01 ENCOUNTER — Other Ambulatory Visit: Payer: Self-pay | Admitting: Neurology

## 2016-01-03 ENCOUNTER — Other Ambulatory Visit: Payer: Medicaid Other

## 2016-01-05 ENCOUNTER — Other Ambulatory Visit: Payer: Medicaid Other

## 2016-01-10 ENCOUNTER — Telehealth: Payer: Self-pay | Admitting: Neurology

## 2016-01-10 ENCOUNTER — Ambulatory Visit
Admission: RE | Admit: 2016-01-10 | Discharge: 2016-01-10 | Disposition: A | Discharge status: Home / Self Care | Payer: Medicaid Other | Source: Ambulatory Visit | Attending: Neurology | Admitting: Neurology

## 2016-01-10 DIAGNOSIS — H4710 Unspecified papilledema: Secondary | ICD-10-CM

## 2016-01-10 DIAGNOSIS — G43009 Migraine without aura, not intractable, without status migrainosus: Secondary | ICD-10-CM

## 2016-01-10 DIAGNOSIS — E669 Obesity, unspecified: Secondary | ICD-10-CM

## 2016-01-10 DIAGNOSIS — R410 Disorientation, unspecified: Secondary | ICD-10-CM

## 2016-01-10 NOTE — Telephone Encounter (Signed)
Patient called to request results of today's MRI results, was advised by [facility] that we would have results today. Please call [phone].

## 2016-01-10 NOTE — Telephone Encounter (Signed)
She was having intolerable side effects with Topamax 100mg , BID.  She was instructed, on 12/26/15, to reduce her dose to 100mg , qhs.  She is feeling better now.  Dr. Terrace ArabiaYan would like to her to slowly attempt to increase her dose again.  She has been instructed to take Topamax 50mg  in am and 100mg , qhs for 1-2 weeks then try to increase to 100mg , BID.

## 2016-01-10 NOTE — Telephone Encounter (Signed)
Please call patient MRI of the brain showed no significant abnormality,  She should continue with lumbar puncture as previously scheduled

## 2016-01-10 NOTE — Telephone Encounter (Signed)
Spoke to patient - aware of results and will keep her LP appt.

## 2016-01-12 ENCOUNTER — Ambulatory Visit
Admission: RE | Admit: 2016-01-12 | Discharge: 2016-01-12 | Disposition: A | Discharge status: Home / Self Care | Payer: Medicaid Other | Source: Ambulatory Visit | Attending: Neurology | Admitting: Neurology

## 2016-01-12 VITALS — BP 125/70 | HR 80

## 2016-01-12 DIAGNOSIS — E669 Obesity, unspecified: Secondary | ICD-10-CM

## 2016-01-12 DIAGNOSIS — R410 Disorientation, unspecified: Secondary | ICD-10-CM

## 2016-01-12 DIAGNOSIS — G43009 Migraine without aura, not intractable, without status migrainosus: Secondary | ICD-10-CM

## 2016-01-12 DIAGNOSIS — H4710 Unspecified papilledema: Secondary | ICD-10-CM

## 2016-01-12 LAB — CSF CELL COUNT WITH DIFFERENTIAL
RBC Count, CSF: 3 cells/uL (ref 0–10)
WBC, CSF: 3 cells/uL (ref 0–5)

## 2016-01-12 LAB — GRAM STAIN
GRAM STAIN: NONE SEEN
Gram Stain: NONE SEEN

## 2016-01-12 LAB — PROTEIN, CSF: Total Protein, CSF: 22 mg/dL (ref 15–45)

## 2016-01-12 LAB — GLUCOSE, CSF: Glucose, CSF: 63 mg/dL (ref 43–76)

## 2016-01-12 NOTE — Discharge Instructions (Signed)

## 2016-01-12 NOTE — Progress Notes (Addendum)
Discharge instructions explained to patient and her mother, along with the possibility that the patient may have headaches mimicking a spinal headache for several days since her opening pressure was 44cm H2O with a closing pressure of 20.  Instructed patient to contact Dr. Terrace ArabiaYan with any questions or concerns. Patient stated Dr. Terrace ArabiaYan told patient she may have IIH.  I did print out and give to patient information on II[facility]/pseudotumor cerebri [facility].  A side note: patient is coughing and blowing her nose a lot.  Worrisome for spinal headache, as well.   jkl

## 2016-01-14 LAB — VDRL, CSF: VDRL Quant, CSF: NONREACTIVE

## 2016-01-15 ENCOUNTER — Telehealth: Payer: Self-pay | Admitting: Neurology

## 2016-01-15 MED ORDER — POTASSIUM CHLORIDE ER 10 MEQ PO TBCR: 10.0000 meq | EXTENDED_RELEASE_TABLET | Freq: Every day | ORAL | 2 refills | Status: DC

## 2016-01-15 MED ORDER — FUROSEMIDE 20 MG PO TABS: 20.0000 mg | ORAL_TABLET | Freq: Every day | ORAL | 2 refills | Status: DC

## 2016-01-15 NOTE — Telephone Encounter (Signed)
I called the patient. She had LP 01/12/16, OP was 44. She is on topamax, 100 mg at night, could not tolerate the daytime dose. She had headache before and after the LP. The headache is not positional. Likely not post-LP headache. I will call in lasix and potassium to treat the BIH.

## 2016-01-16 ENCOUNTER — Encounter: Payer: Self-pay | Admitting: Neurology

## 2016-01-16 ENCOUNTER — Ambulatory Visit (INDEPENDENT_AMBULATORY_CARE_PROVIDER_SITE_OTHER): Payer: Medicaid Other | Admitting: Neurology

## 2016-01-16 VITALS — BP 128/80 | HR 72 | Ht 62.0 in | Wt 319.0 lb

## 2016-01-16 DIAGNOSIS — R55 Syncope and collapse: Secondary | ICD-10-CM | POA: Diagnosis not present

## 2016-01-16 DIAGNOSIS — R404 Transient alteration of awareness: Secondary | ICD-10-CM | POA: Insufficient documentation

## 2016-01-16 DIAGNOSIS — G932 Benign intracranial hypertension: Secondary | ICD-10-CM

## 2016-01-16 HISTORY — DX: Benign intracranial hypertension: G93.2

## 2016-01-16 MED ORDER — PREDNISONE 5 MG PO TABS: ORAL_TABLET | ORAL | 0 refills | Status: DC

## 2016-01-16 MED ORDER — TRAMADOL HCL 50 MG PO TABS: 50.0000 mg | ORAL_TABLET | Freq: Four times a day (QID) | ORAL | 1 refills | Status: DC | PRN

## 2016-01-16 NOTE — Progress Notes (Signed)
Reason for visit: Pseudotumor cerebri  Jessica Massey is a 25 y.o. female  History of present illness:  [name] is a 25 year old right-handed white female with a history of pseudotumor cerebri and morbid obesity. The patient has recently undergone an MRI scan of the brain that was unremarkable, she has had lumbar puncture that confirmed an opening pressure of 44 cm. The patient has been placed on Topamax, she could not tolerate the 200 mg dosing, she cut back to 100 mg at night. The patient has had increased headaches following the lumbar puncture that are present with sitting, standing, and lying down. The headaches are worse first thing in the morning, she may sometimes wake up and not have good vision in the left eye and it will clear up shortly after getting out of bed. The patient has also noted that she may have some discomfort in the base of the skull with coughing since the spinal tap. The patient indicates that after she gets out of bed the headache will improve some, but then worsens later in the evening. The patient denies any numbness or weakness of the face, arms, or legs. She denies any balance problems or difficulty controlling the bowels or the bladder. She was placed on Lasix and potassium yesterday, she has not been able to work because the headache, she comes in today for an urgent evaluation. The patient also reports an episode last evening where she became unresponsive for several moments, she sat down, and felt dizzy. She has had these events before, a prior EEG study was normal.  Past Medical History:  Diagnosis Date  . Optic disc edema   . Pseudotumor cerebri 01/16/2016    Past Surgical History:  Procedure Laterality Date  . APPENDECTOMY      Family History  Problem Relation Age of Onset  . Hypertension Mother   . Diabetes Mother   . Aneurysm Father   . High blood pressure    . Breast cancer Maternal Grandmother     Social history:  reports that she  has been smoking Cigarettes.  She has been smoking about 0.25 packs per day. She has never used smokeless tobacco. She reports that she drinks alcohol. She reports that she does not use drugs.  Medications:  Prior to Admission medications   Medication Sig Start Date End Date Taking? Authorizing Provider  acetaminophen (TYLENOL) 500 MG tablet Take 1,500 mg by mouth every 6 (six) hours as needed for mild pain, moderate pain or headache.   Yes Historical Provider, MD  furosemide (LASIX) 20 MG tablet Take 1 tablet (20 mg total) by mouth daily. 01/15/16  Yes York Spanielharles K Cortney Mckinney, MD  Multiple Vitamin (MULTIVITAMIN) capsule Take 1 capsule by mouth daily.   Yes Historical Provider, MD  potassium chloride (K-DUR) 10 MEQ tablet Take 1 tablet (10 mEq total) by mouth daily. 01/15/16  Yes York Spanielharles K Marsheila Alejo, MD  SUMAtriptan (IMITREX) 100 MG tablet Take 1 tablet (100 mg total) by mouth every 2 (two) hours as needed for migraine. May repeat in 2 hours if headache persists or recurs. 12/21/15  Yes Levert FeinsteinYijun Yan, MD  topiramate (TOPAMAX) 100 MG tablet Take 1 tablet (100 mg total) by mouth 2 (two) times daily. 12/21/15  Yes Levert FeinsteinYijun Yan, MD     No Known Allergies  ROS:  Out of a complete 14 system review of symptoms, the patient complains only of the following symptoms, and all other reviewed systems are negative.  Eye pain, headache  Blood pressure 128/80, pulse 72, height 5\' 2"  (1.575 m), weight (!) 319 lb (144.7 kg).  Physical Exam  General: The patient is alert and cooperative at the time of the examination. The patient is morbidly obese.  Eyes: Pupils are equal, round, and reactive to light. Discs are blurred on the margins bilaterally, no hemorrhages are seen.  Neck: The neck is supple, no carotid bruits are noted.  Respiratory: The respiratory examination is clear.  Cardiovascular: The cardiovascular examination reveals a regular rate and rhythm, no obvious murmurs or rubs are noted.  Skin: Extremities  are without significant edema.  Neurologic Exam  Mental status: The patient is alert and oriented x 3 at the time of the examination. The patient has apparent normal recent and remote memory, with an apparently normal attention span and concentration ability.  Cranial nerves: Facial symmetry is present. There is good sensation of the face to pinprick and soft touch bilaterally. The strength of the facial muscles and the muscles to head turning and shoulder shrug are normal bilaterally. Speech is well enunciated, no aphasia or dysarthria is noted. Extraocular movements are full. Visual fields are full. The tongue is midline, and the patient has symmetric elevation of the soft palate. No obvious hearing deficits are noted.  Motor: The motor testing reveals 5 over 5 strength of all 4 extremities. Good symmetric motor tone is noted throughout.  Sensory: Sensory testing is intact to pinprick, soft touch, and vibration sensation on all 4 extremities with exception that pinprick is slightly decreased on the left face and arm, symmetric on the legs. No evidence of extinction is noted.  Coordination: Cerebellar testing reveals good finger-nose-finger and heel-to-shin bilaterally.  Gait and station: Gait is normal. Tandem gait is normal. Romberg is negative. No drift is seen.  Reflexes: Deep tendon reflexes are symmetric and normal bilaterally. Toes are downgoing bilaterally.    MRI brain 01/10/16:  IMPRESSION:  Unremarkable MRI brain (without). No acute findings. No change from MRI on 11/23/13.  * MRI scan images were reviewed online. I agree with the written report.   Assessment/Plan:  1. Pseudotumor cerebri  2. Morbid obesity  3. Transient alteration of awareness  The patient is having headaches with sitting, standing, and lying down, and likely are not post LP headaches, but rather related to the extreme elevations in the CSF pressure. The patient is having episodes of visual dimming in  the morning when she wakes up, she may be at risk for losing vision if the high intracranial pressure is not treated rapidly. The patient will continue the Lasix and Topamax, prednisone will be added with a 5 mg six-day dosepak, the patient was given a prescription for Ultram. I have instructed her that she needs to get on an aggressive weight loss program. She is to follow-up with her ophthalmologist. She was given a note to keep her out of work until 01/22/2016. The patient will be set up for an EEG study to evaluate the episode of transient alteration of awareness.   Marlan Palau. Keith Naser Schuld MD 01/16/2016 5:17 PM  [facility] [address] [place], [place] [id number]  Phone [phone] Fax [phone]

## 2016-01-16 NOTE — Telephone Encounter (Signed)
Dr. Terrace ArabiaYan pt stopped by office this afternoon along w/ her mother, Jessica Massey. Says that she did take Lasix and potassium as prescribed this morning. Also took Topamax at bedtime last night which usually helps with sleep. However, she did not sleep well and reports a seizure sometime during the night. In addition to topiramate, she's taken Imitrex x 2 today w/o relief of HA.

## 2016-01-16 NOTE — Telephone Encounter (Signed)
Ok to work in today

## 2016-01-16 NOTE — Telephone Encounter (Signed)
Pt called in this morning stating she could not sleep last night because of the pain. She took (4) Tylenol 650 mg last night and it did not help. Pt has taken first dose of Lasix and Potassium this morning. Please call and advise

## 2016-01-22 ENCOUNTER — Ambulatory Visit: Payer: Self-pay | Admitting: Neurology

## 2016-01-23 ENCOUNTER — Telehealth: Payer: Self-pay | Admitting: Neurology

## 2016-01-23 NOTE — Telephone Encounter (Signed)
erorr

## 2016-01-23 NOTE — Telephone Encounter (Addendum)
Patient is scheduled for EEG

## 2016-02-09 LAB — FUNGUS CULTURE W SMEAR

## 2016-02-13 ENCOUNTER — Telehealth: Payer: Self-pay | Admitting: Neurology

## 2016-02-13 ENCOUNTER — Ambulatory Visit (INDEPENDENT_AMBULATORY_CARE_PROVIDER_SITE_OTHER): Payer: Medicaid Other | Admitting: Neurology

## 2016-02-13 DIAGNOSIS — R55 Syncope and collapse: Secondary | ICD-10-CM | POA: Diagnosis not present

## 2016-02-13 DIAGNOSIS — G932 Benign intracranial hypertension: Secondary | ICD-10-CM

## 2016-02-13 NOTE — Telephone Encounter (Signed)
I called patient. The patient indicates that she has not had any further blackout episodes, the EEG study was unremarkable.

## 2016-02-13 NOTE — Procedures (Signed)
    History:  Juan QuamBrooke Obst is a 25 year old patient with a history of pseudotumor cerebri and obesity. The patient has had an event on 01/15/2016 of unresponsiveness. The patient indicates that she has had similar events previously, associated with dizziness.  This is a routine EEG. No skull defects are noted. Medications include Lasix, multivitamins, potassium supplementation, Imitrex, and Topamax.   EEG classification: Normal awake  Description of the recording: The background rhythms of this recording consists of a fairly well modulated medium amplitude alpha rhythm of 9 Hz that is reactive to eye opening and closure. As the record progresses, the patient appears to remain in the waking state throughout the recording. Photic stimulation was performed, resulting in a bilateral and symmetric photic driving response. Hyperventilation was also performed, resulting in a minimal buildup of the background rhythm activities without significant slowing seen. At no time during the recording does there appear to be evidence of spike or spike wave discharges or evidence of focal slowing. EKG monitor shows no evidence of cardiac rhythm abnormalities with a heart rate of 78.  Impression: This is a normal EEG recording in the waking state. No evidence of ictal or interictal discharges are seen.

## 2016-03-04 ENCOUNTER — Encounter: Payer: Self-pay | Admitting: Neurology

## 2016-03-04 ENCOUNTER — Ambulatory Visit (INDEPENDENT_AMBULATORY_CARE_PROVIDER_SITE_OTHER): Payer: Medicaid Other | Admitting: Neurology

## 2016-03-04 VITALS — BP 123/86 | HR 86 | Ht 62.0 in | Wt 322.8 lb

## 2016-03-04 DIAGNOSIS — G932 Benign intracranial hypertension: Secondary | ICD-10-CM

## 2016-03-04 DIAGNOSIS — R55 Syncope and collapse: Secondary | ICD-10-CM | POA: Diagnosis not present

## 2016-03-04 DIAGNOSIS — G43009 Migraine without aura, not intractable, without status migrainosus: Secondary | ICD-10-CM

## 2016-03-04 MED ORDER — TOPIRAMATE 100 MG PO TABS: 200.0000 mg | ORAL_TABLET | Freq: Every day | ORAL | 11 refills | Status: DC

## 2016-03-04 MED ORDER — ACETAZOLAMIDE ER 500 MG PO CP12: 500.0000 mg | ORAL_CAPSULE | Freq: Two times a day (BID) | ORAL | 11 refills | Status: DC

## 2016-03-04 MED ORDER — TOPIRAMATE ER 200 MG PO CAP24: 200.0000 mg | ORAL_CAPSULE | Freq: Every day | ORAL | 11 refills | Status: DC

## 2016-03-04 NOTE — Progress Notes (Addendum)
PATIENT: Jessica Massey DOB: 1990-07-17  Chief Complaint  Patient presents with  . Pseudotumor Cerebri    She would like to review her MRI and LP results.  Her vision has improved and she rarely has blurred vision.  . Migraine    She is only taking Topamax 100mg  at qhs. She had difficulty with the daytime dose and has never tried to titrate up again.  Imitrex is sometimes helpful in relieving the pain.  Occasionally, she will use Tylenol in addition to the Imitrex.  . Syncope and Collapse    She would like to review her EEG.     HISTORICAL   Jessica Massey to 26 years old right-handed female, seen in refer by her optometrist Estrella Deeds for evaluation of bilateral papillary edema, her primary care physician is Carlye Grippe, MD, evaluation was on December 21 2015.  She reported a history of migraine headache in the past, getting worse since January 2017, 2-3 times each week, follow cranial severe pounding headache with associated light noise sensitivity, at the same time noticed blurry vision, especially with sudden positional change took a while to focus, hazy sensation,  She reported 50 pound weight gain over past 12 months,  For her migraine headache she has tried over-the-counter Tylenol, ibuprofen without benefit,  I reviewed and summarized optometry report on December 01 2015, visual acuity with correction OD 20/50, OS 20/60, intraocular pressure 11, evidence of bilateral papillary edema,  I saw her previously in 2015 were confusion episode, I have personally reviewed MRI of the brain 2015 that was normal, EEG was normal in 2015 , Update March 04 2016: She had lumbar puncture January 12 2016 open pressure was 44, CSF was normal, We have personally reviewed MRI of the brain in November 2017 there was no significant abnormality.  Her headache has much improved since LP, once a week, bilateral retrorbital pressure headache with associated light noise sensitivity, Imitrex  100 mg as needed was helpful most of the time,  EEG was normal, She also reported one episode of passing out and of November 2017, after get up from seated position, she felt lightheaded, then falling backwards, she remember her hands toward up, body shaking, she came here surrounding, but could not responding, lasting about 10 minutes,  She complains side effect of Topamax, numbness tingling and confusion  REVIEW OF SYSTEMS: Full 14 system review of systems performed and notable only for weight gain, blurry vision, eye pain, headaches, seizure, depression  ALLERGIES: No Known Allergies  HOME MEDICATIONS: Current Outpatient Prescriptions  Medication Sig Dispense Refill  . acetaminophen (TYLENOL) 500 MG tablet Take 1,500 mg by mouth every 6 (six) hours as needed for mild pain, moderate pain or headache.    . furosemide (LASIX) 20 MG tablet Take 1 tablet (20 mg total) by mouth daily. 30 tablet 2  . Multiple Vitamin (MULTIVITAMIN) capsule Take 1 capsule by mouth daily.    . potassium chloride (K-DUR) 10 MEQ tablet Take 1 tablet (10 mEq total) by mouth daily. 30 tablet 2  . SUMAtriptan (IMITREX) 100 MG tablet Take 1 tablet (100 mg total) by mouth every 2 (two) hours as needed for migraine. May repeat in 2 hours if headache persists or recurs. 12 tablet 11  . topiramate (TOPAMAX) 100 MG tablet Take 1 tablet (100 mg total) by mouth 2 (two) times daily. 60 tablet 11  . traMADol (ULTRAM) 50 MG tablet Take 1 tablet (50 mg total) by mouth every 6 (six)  hours as needed. 50 tablet 1   No current facility-administered medications for this visit.     PAST MEDICAL HISTORY: Past Medical History:  Diagnosis Date  . Optic disc edema   . Pseudotumor cerebri 01/16/2016    PAST SURGICAL HISTORY: Past Surgical History:  Procedure Laterality Date  . APPENDECTOMY      FAMILY HISTORY: Family History  Problem Relation Age of Onset  . Hypertension Mother   . Diabetes Mother   . Aneurysm Father   .  High blood pressure    . Breast cancer Maternal Grandmother   . Asthma Brother   . Spina bifida Brother     SOCIAL HISTORY:  Social History   Social History  . Marital status: Single    Spouse name: N/A  . Number of children: 2  . Years of education: 8th   Occupational History  .  [employer]   Social History Main Topics  . Smoking status: Current Every Day Smoker    Packs/day: 0.25    Types: Cigarettes  . Smokeless tobacco: Never Used  . Alcohol use Yes     Comment: Occasional use  . Drug use: No  . Sexual activity: Not on file   Other Topics Concern  . Not on file   Social History Narrative   Patient lives at home with her family.   Patient works at [employer].   Patient is right handed.   Caffeine - 32oz per day.     PHYSICAL EXAM   Vitals:   03/04/16 1524  BP: 123/86  Pulse: 86  Weight: (!) 322 lb 12 oz (146.4 kg)  Height: 5\' 2"  (1.575 m)    Not recorded      Body mass index is 59.03 kg/m.  PHYSICAL EXAMNIATION:  Gen: NAD, conversant, well nourised, obese, well groomed                     Cardiovascular: Regular rate rhythm, no peripheral edema, warm, nontender. Eyes: Conjunctivae clear without exudates or hemorrhage Neck: Supple, no carotid bruits. Pulmonary: Clear to auscultation bilaterally   NEUROLOGICAL EXAM:  MENTAL STATUS: Speech:    Speech is normal; fluent and spontaneous with normal comprehension.  Cognition:     Orientation to time, place and person     Normal recent and remote memory     Normal Attention span and concentration     Normal Language, naming, repeating,spontaneous speech     Fund of knowledge   CRANIAL NERVES: CN II: Visual fields are full to confrontation. Fundoscopic exam showed mild blurry vision. Pupils are round equal and briskly reactive to light. CN III, IV, VI: extraocular movement are normal. No ptosis. CN V: Facial sensation is intact to pinprick in all 3 divisions bilaterally. Corneal  responses are intact.  CN VII: Face is symmetric with normal eye closure and smile. CN VIII: Hearing is normal to rubbing fingers CN IX, X: Palate elevates symmetrically. Phonation is normal. CN XI: Head turning and shoulder shrug are intact CN XII: Tongue is midline with normal movements and no atrophy.  MOTOR: There is no pronator drift of out-stretched arms. Muscle bulk and tone are normal. Muscle strength is normal.  REFLEXES: Reflexes are 2+ and symmetric at the biceps, triceps, knees, and ankles. Plantar responses are flexor.  SENSORY: Intact to light touch, pinprick, positional sensation and vibratory sensation are intact in fingers and toes.  COORDINATION: Rapid alternating movements and fine finger movements are intact. There is  no dysmetria on finger-to-nose and heel-knee-shin.    GAIT/STANCE: Posture is normal. Gait is steady with normal steps, base, arm swing, and turning. Heel and toe walking are normal. Tandem gait is normal.  Romberg is absent.   DIAGNOSTIC DATA (LABS, IMAGING, TESTING) - I reviewed patient records, labs, notes, testing and imaging myself where available.   ASSESSMENT AND PLAN  Jessica Massey is a 26 y.o. female   Pseudotumor cerebri  She had rapid weight gain of 50 pounds over past 1 year I have suggested her moderate exercise, weight loss, diet Continue Topamax 100mg  2 qhs ( or Topamax ER 200mg  qhs if insurance covers)  Worsening chronic migraine headaches Imitrex 100 mg as needed  Passing out episodes  EEG and MRI brain was normal in 2017. Possible seizure vs syncope  Levert FeinsteinYijun Charly Holcomb, M.D. Ph.D.  [facility] [address], [address] [place], [place] [address] Ph: [phone] Fax: [phone]  CC: Referring Provider

## 2016-03-05 LAB — CBC WITH DIFFERENTIAL/PLATELET
Basophils Absolute: 0.1 10*3/uL (ref 0.0–0.2)
Basos: 1 %
EOS (ABSOLUTE): 0.1 10*3/uL (ref 0.0–0.4)
EOS: 1 %
HEMATOCRIT: 42.2 % (ref 34.0–46.6)
HEMOGLOBIN: 14.3 g/dL (ref 11.1–15.9)
IMMATURE GRANS (ABS): 0.1 10*3/uL (ref 0.0–0.1)
IMMATURE GRANULOCYTES: 1 %
Lymphocytes Absolute: 3.3 10*3/uL — ABNORMAL HIGH (ref 0.7–3.1)
Lymphs: 30 %
MCH: 28.5 pg (ref 26.6–33.0)
MCHC: 33.9 g/dL (ref 31.5–35.7)
MCV: 84 fL (ref 79–97)
Monocytes Absolute: 0.9 10*3/uL (ref 0.1–0.9)
Monocytes: 9 %
NEUTROS ABS: 6.5 10*3/uL (ref 1.4–7.0)
Neutrophils: 58 %
Platelets: 249 10*3/uL (ref 150–379)
RBC: 5.01 x10E6/uL (ref 3.77–5.28)
RDW: 14.3 % (ref 12.3–15.4)
WBC: 11 10*3/uL — AB (ref 3.4–10.8)

## 2016-03-05 LAB — COMPREHENSIVE METABOLIC PANEL
A/G RATIO: 1.6 (ref 1.2–2.2)
ALK PHOS: 62 IU/L (ref 39–117)
ALT: 23 IU/L (ref 0–32)
AST: 24 IU/L (ref 0–40)
Albumin: 4.5 g/dL (ref 3.5–5.5)
BILIRUBIN TOTAL: 0.3 mg/dL (ref 0.0–1.2)
BUN/Creatinine Ratio: 14 (ref 9–23)
BUN: 11 mg/dL (ref 6–20)
CHLORIDE: 98 mmol/L (ref 96–106)
CO2: 24 mmol/L (ref 18–29)
Calcium: 9.5 mg/dL (ref 8.7–10.2)
Creatinine, Ser: 0.8 mg/dL (ref 0.57–1.00)
GFR calc Af Amer: 119 mL/min/{1.73_m2} (ref >59)
GFR calc non Af Amer: 103 mL/min/{1.73_m2} (ref >59)
GLOBULIN, TOTAL: 2.9 g/dL (ref 1.5–4.5)
Glucose: 81 mg/dL (ref 65–99)
POTASSIUM: 4 mmol/L (ref 3.5–5.2)
SODIUM: 140 mmol/L (ref 134–144)
Total Protein: 7.4 g/dL (ref 6.0–8.5)

## 2016-03-05 LAB — TSH: TSH: 2.71 u[IU]/mL (ref 0.450–4.500)

## 2016-03-05 LAB — VITAMIN D 25 HYDROXY (VIT D DEFICIENCY, FRACTURES): VIT D 25 HYDROXY: 25.1 ng/mL — AB (ref 30.0–100.0)

## 2016-03-05 LAB — VITAMIN B12: VITAMIN B 12: 374 pg/mL (ref 232–1245)

## 2016-03-06 ENCOUNTER — Telehealth: Payer: Self-pay | Admitting: *Deleted

## 2016-03-06 ENCOUNTER — Encounter: Payer: Self-pay | Admitting: *Deleted

## 2016-03-06 NOTE — Telephone Encounter (Signed)
[place]R [place] [id number]) through 03/01/17 - HQ#[id number]#[id number] - pt MW#[id number] R.

## 2016-04-25 ENCOUNTER — Telehealth: Payer: Self-pay | Admitting: Neurology

## 2016-04-25 NOTE — Telephone Encounter (Signed)
I attempted to reach the patient - left message for a return call.

## 2016-04-25 NOTE — Telephone Encounter (Signed)
Pt says Topiramate ER 200 MG CP24 has caused N & V, drowsy-almost went to sleep while driving, felt funny. Says she had to leave school today and come home. Pt said she took it last night at 10:30, she got up at 7am today. Please call

## 2016-04-26 NOTE — Telephone Encounter (Signed)
I have called patient, she is seeing ophthalmologist, I was able to talk with her husband, patient complains of headache, nausea, not feeling well, I do not think it is due to the side effect of the topiramate, she has been on it for a while,  I have encouraged her continued topiramate ER 200 mg every night, increase water intake

## 2016-06-11 ENCOUNTER — Ambulatory Visit: Payer: Medicaid Other | Admitting: Neurology

## 2016-06-12 ENCOUNTER — Ambulatory Visit: Payer: Self-pay | Admitting: Neurology

## 2016-06-12 ENCOUNTER — Telehealth: Payer: Self-pay | Admitting: *Deleted

## 2016-06-12 NOTE — Telephone Encounter (Signed)
No showed follow up appointment. 

## 2016-06-13 ENCOUNTER — Encounter: Payer: Self-pay | Admitting: Neurology

## 2016-11-13 ENCOUNTER — Telehealth: Payer: Self-pay | Admitting: Neurology

## 2016-11-13 NOTE — Telephone Encounter (Signed)
I called pt back after there was a response from billing that pt has a balance, pt was connected over to billing and she has been made aware of why the appointment was not scheduled

## 2016-11-13 NOTE — Telephone Encounter (Signed)
Pt is asking for a call back, she states she has stopped taking her medication because she does not like the way it makes her feel.  Pt states her head aches are now worse than b4 and would like to see what else Dr Terrace Arabia may suggest please call.  Pt was offered 1st avail appointment showing 1st week of Dec. Pt stated that is far off and feels she could fall out if had to wait that long, please call

## 2016-12-13 ENCOUNTER — Telehealth: Payer: Self-pay | Admitting: Neurology

## 2016-12-13 NOTE — Telephone Encounter (Signed)
I reviewed feedback from ophthalmologist Dr. Alden HippGrote on November 19 2016, history of pseudotumor cerebri, patient complaining of worsening headaches, visual field actually improved, she had no edema on examination, she has stopped Topamax due to side effect, based on evaluation by Dr. Alden HippGrote, she has papillary edema and disc drusen.  Baseline photos, and serials OCT showed definite margin blurry bilaterally, with resolution of edema

## 2017-02-19 ENCOUNTER — Other Ambulatory Visit: Payer: Self-pay | Admitting: General Surgery

## 2017-02-19 DIAGNOSIS — Z01818 Encounter for other preprocedural examination: Secondary | ICD-10-CM

## 2017-02-25 NOTE — L&D Delivery Note (Signed)
Delivery Note Pt progressed to a rim dilation and had strong urge to push.  Rim easily reduced with pushing and at 11:39 AM a healthy female was delivered via Vaginal, Spontaneous (Presentation: OA  ).  APGAR: 8, 9; weight  pending .   Placenta status: delivered spontaneously.  Cord:  with the following complications: none .   Anesthesia:  None Episiotomy: None Lacerations: None Suture Repair: n/a Est. Blood Loss (mL):  125mL  Mom to postpartum.  Baby to Couplet care / Skin to Skin.  Oliver PilaKathy W Otilio Groleau 11/11/2017, 12:05 PM

## 2017-02-27 ENCOUNTER — Other Ambulatory Visit: Payer: Self-pay

## 2017-02-27 ENCOUNTER — Ambulatory Visit
Admission: RE | Admit: 2017-02-27 | Discharge: 2017-02-27 | Disposition: A | Discharge status: Home / Self Care | Payer: Medicaid Other | Source: Ambulatory Visit | Attending: General Surgery | Admitting: General Surgery

## 2017-02-27 ENCOUNTER — Encounter (HOSPITAL_COMMUNITY): Payer: Self-pay | Admitting: *Deleted

## 2017-02-27 DIAGNOSIS — Z01818 Encounter for other preprocedural examination: Secondary | ICD-10-CM

## 2017-02-27 MED ORDER — DEXTROSE 5 % IV SOLN
3.0000 g | INTRAVENOUS | Status: AC
  Administered 2017-02-28: 3 g via INTRAVENOUS
  Filled 2017-02-27: qty 3

## 2017-02-27 NOTE — Progress Notes (Signed)
Jessica Massey saw Dr Patrecia PaceWills in 2017 for a Syncope episode.  Jessica Massey states that she did not pass out.  "I thought it was a seizure , "I tense up, draw up, can't move can't talk."  Jessica Massey knows what is going on , she did not fall.  Seizure was ruled out and this has not happened since.

## 2017-02-28 ENCOUNTER — Ambulatory Visit (HOSPITAL_COMMUNITY)
Admission: RE | Admit: 2017-02-28 | Discharge: 2017-02-28 | Disposition: A | Discharge status: Home / Self Care | Payer: Medicaid Other | Source: Ambulatory Visit | Attending: Oral Surgery | Admitting: Oral Surgery

## 2017-02-28 ENCOUNTER — Ambulatory Visit (HOSPITAL_COMMUNITY): Payer: Medicaid Other | Admitting: Certified Registered Nurse Anesthetist

## 2017-02-28 ENCOUNTER — Encounter (HOSPITAL_COMMUNITY): Payer: Self-pay

## 2017-02-28 ENCOUNTER — Encounter (HOSPITAL_COMMUNITY)
Admission: RE | Disposition: A | Discharge status: Home / Self Care | Payer: Self-pay | Source: Ambulatory Visit | Attending: Oral Surgery

## 2017-02-28 DIAGNOSIS — Z87891 Personal history of nicotine dependence: Secondary | ICD-10-CM | POA: Insufficient documentation

## 2017-02-28 DIAGNOSIS — K029 Dental caries, unspecified: Secondary | ICD-10-CM | POA: Insufficient documentation

## 2017-02-28 DIAGNOSIS — K011 Impacted teeth: Secondary | ICD-10-CM | POA: Insufficient documentation

## 2017-02-28 DIAGNOSIS — Z6841 Body Mass Index (BMI) 40.0 and over, adult: Secondary | ICD-10-CM | POA: Diagnosis not present

## 2017-02-28 DIAGNOSIS — G932 Benign intracranial hypertension: Secondary | ICD-10-CM | POA: Insufficient documentation

## 2017-02-28 HISTORY — DX: Headache, unspecified: R51.9

## 2017-02-28 HISTORY — DX: Constipation, unspecified: K59.00

## 2017-02-28 HISTORY — DX: Dyspnea, unspecified: R06.00

## 2017-02-28 HISTORY — PX: TOOTH EXTRACTION: SHX859

## 2017-02-28 HISTORY — DX: Headache: R51

## 2017-02-28 LAB — HCG, SERUM, QUALITATIVE: Preg, Serum: NEGATIVE

## 2017-02-28 LAB — HEMOGLOBIN: Hemoglobin: 13.1 g/dL (ref 12.0–15.0)

## 2017-02-28 SURGERY — DENTAL RESTORATION/EXTRACTIONS
Anesthesia: General | Site: Mouth

## 2017-02-28 MED ORDER — LACTATED RINGERS IV SOLN
INTRAVENOUS | Status: DC
  Administered 2017-02-28: 07:00:00 via INTRAVENOUS

## 2017-02-28 MED ORDER — OXYCODONE-ACETAMINOPHEN 5-325 MG PO TABS: 1.0000 | ORAL_TABLET | Freq: Four times a day (QID) | ORAL | 0 refills | Status: DC | PRN

## 2017-02-28 MED ORDER — DEXAMETHASONE SODIUM PHOSPHATE 10 MG/ML IJ SOLN
INTRAMUSCULAR | Status: AC
  Filled 2017-02-28: qty 1

## 2017-02-28 MED ORDER — ACETAMINOPHEN 160 MG/5ML PO SOLN: 325.0000 mg | ORAL | Status: DC | PRN

## 2017-02-28 MED ORDER — LIDOCAINE-EPINEPHRINE 2 %-1:100000 IJ SOLN
INTRAMUSCULAR | Status: AC
  Filled 2017-02-28: qty 1.7

## 2017-02-28 MED ORDER — SUCCINYLCHOLINE CHLORIDE 200 MG/10ML IV SOSY
PREFILLED_SYRINGE | INTRAVENOUS | Status: AC
  Filled 2017-02-28: qty 10

## 2017-02-28 MED ORDER — SODIUM CHLORIDE 0.9 % IR SOLN
Status: DC | PRN
  Administered 2017-02-28: 1000 mL

## 2017-02-28 MED ORDER — LIDOCAINE HCL (CARDIAC) 20 MG/ML IV SOLN
INTRAVENOUS | Status: DC | PRN
  Administered 2017-02-28: 80 mg via INTRATRACHEAL

## 2017-02-28 MED ORDER — 0.9 % SODIUM CHLORIDE (POUR BTL) OPTIME
TOPICAL | Status: DC | PRN
  Administered 2017-02-28: 1000 mL

## 2017-02-28 MED ORDER — PHENYLEPHRINE 40 MCG/ML (10ML) SYRINGE FOR IV PUSH (FOR BLOOD PRESSURE SUPPORT)
PREFILLED_SYRINGE | INTRAVENOUS | Status: AC
  Filled 2017-02-28: qty 10

## 2017-02-28 MED ORDER — MIDAZOLAM HCL 2 MG/2ML IJ SOLN
INTRAMUSCULAR | Status: DC | PRN
  Administered 2017-02-28: 2 mg via INTRAVENOUS

## 2017-02-28 MED ORDER — LIDOCAINE 2% (20 MG/ML) 5 ML SYRINGE
INTRAMUSCULAR | Status: AC
Start: 2017-02-28 — End: ?
  Filled 2017-02-28: qty 5

## 2017-02-28 MED ORDER — MIDAZOLAM HCL 2 MG/2ML IJ SOLN
INTRAMUSCULAR | Status: AC
  Filled 2017-02-28: qty 2

## 2017-02-28 MED ORDER — ONDANSETRON HCL 4 MG/2ML IJ SOLN
INTRAMUSCULAR | Status: AC
  Filled 2017-02-28: qty 2

## 2017-02-28 MED ORDER — LIDOCAINE-EPINEPHRINE 2 %-1:100000 IJ SOLN
INTRAMUSCULAR | Status: AC
  Filled 2017-02-28: qty 1

## 2017-02-28 MED ORDER — OXYMETAZOLINE HCL 0.05 % NA SOLN
NASAL | Status: DC | PRN
  Administered 2017-02-28 (×3): 2 via NASAL

## 2017-02-28 MED ORDER — OXYCODONE HCL 5 MG PO TABS: 5.0000 mg | ORAL_TABLET | Freq: Once | ORAL | Status: DC | PRN

## 2017-02-28 MED ORDER — DEXAMETHASONE SODIUM PHOSPHATE 10 MG/ML IJ SOLN
INTRAMUSCULAR | Status: DC | PRN
  Administered 2017-02-28: 10 mg via INTRAVENOUS

## 2017-02-28 MED ORDER — ONDANSETRON HCL 4 MG/2ML IJ SOLN
INTRAMUSCULAR | Status: DC | PRN
  Administered 2017-02-28: 4 mg via INTRAVENOUS

## 2017-02-28 MED ORDER — LIDOCAINE 2% (20 MG/ML) 5 ML SYRINGE
INTRAMUSCULAR | Status: AC
  Filled 2017-02-28: qty 5

## 2017-02-28 MED ORDER — FENTANYL CITRATE (PF) 250 MCG/5ML IJ SOLN
INTRAMUSCULAR | Status: DC | PRN
  Administered 2017-02-28 (×2): 75 ug via INTRAVENOUS

## 2017-02-28 MED ORDER — PROPOFOL 10 MG/ML IV BOLUS
INTRAVENOUS | Status: AC
  Filled 2017-02-28: qty 40

## 2017-02-28 MED ORDER — OXYMETAZOLINE HCL 0.05 % NA SOLN
NASAL | Status: AC
  Filled 2017-02-28: qty 15

## 2017-02-28 MED ORDER — FENTANYL CITRATE (PF) 100 MCG/2ML IJ SOLN: 25.0000 ug | INTRAMUSCULAR | Status: DC | PRN

## 2017-02-28 MED ORDER — SUGAMMADEX SODIUM 200 MG/2ML IV SOLN
INTRAVENOUS | Status: DC | PRN
  Administered 2017-02-28: 200 mg via INTRAVENOUS

## 2017-02-28 MED ORDER — ROCURONIUM BROMIDE 100 MG/10ML IV SOLN
INTRAVENOUS | Status: DC | PRN
  Administered 2017-02-28: 50 mg via INTRAVENOUS

## 2017-02-28 MED ORDER — PROPOFOL 10 MG/ML IV BOLUS
INTRAVENOUS | Status: AC
  Filled 2017-02-28: qty 20

## 2017-02-28 MED ORDER — OXYCODONE HCL 5 MG/5ML PO SOLN: 5.0000 mg | Freq: Once | ORAL | Status: DC | PRN

## 2017-02-28 MED ORDER — FENTANYL CITRATE (PF) 250 MCG/5ML IJ SOLN
INTRAMUSCULAR | Status: AC
  Filled 2017-02-28: qty 5

## 2017-02-28 MED ORDER — SUGAMMADEX SODIUM 200 MG/2ML IV SOLN
INTRAVENOUS | Status: AC
  Filled 2017-02-28: qty 2

## 2017-02-28 MED ORDER — LIDOCAINE-EPINEPHRINE 2 %-1:100000 IJ SOLN
INTRAMUSCULAR | Status: DC | PRN
  Administered 2017-02-28: 15 mL via INTRADERMAL

## 2017-02-28 MED ORDER — AMOXICILLIN 500 MG PO CAPS: 500.0000 mg | ORAL_CAPSULE | Freq: Three times a day (TID) | ORAL | 0 refills | Status: DC

## 2017-02-28 MED ORDER — ACETAMINOPHEN 325 MG PO TABS: 325.0000 mg | ORAL_TABLET | ORAL | Status: DC | PRN

## 2017-02-28 MED ORDER — EPHEDRINE 5 MG/ML INJ
INTRAVENOUS | Status: AC
Start: 2017-02-28 — End: ?
  Filled 2017-02-28: qty 10

## 2017-02-28 MED ORDER — ROCURONIUM BROMIDE 10 MG/ML (PF) SYRINGE
PREFILLED_SYRINGE | INTRAVENOUS | Status: AC
  Filled 2017-02-28: qty 5

## 2017-02-28 MED ORDER — PROPOFOL 10 MG/ML IV BOLUS
INTRAVENOUS | Status: DC | PRN
  Administered 2017-02-28: 160 mg via INTRAVENOUS

## 2017-02-28 SURGICAL SUPPLY — 33 items
BLADE SURG 15 STRL LF DISP TIS (BLADE) IMPLANT
BLADE SURG 15 STRL SS (BLADE)
BUR CROSS CUT FISSURE 1.6 (BURR) ×2 IMPLANT
BUR CROSS CUT FISSURE 1.6MM (BURR) ×1
BUR EGG ELITE 4.0 (BURR) IMPLANT
BUR EGG ELITE 4.0MM (BURR)
CANISTER SUCT 3000ML PPV (MISCELLANEOUS) ×3 IMPLANT
COVER SURGICAL LIGHT HANDLE (MISCELLANEOUS) ×3 IMPLANT
GAUZE PACKING FOLDED 2  STR (GAUZE/BANDAGES/DRESSINGS) ×2
GAUZE PACKING FOLDED 2 STR (GAUZE/BANDAGES/DRESSINGS) ×1 IMPLANT
GLOVE BIO SURGEON STRL SZ 6.5 (GLOVE) IMPLANT
GLOVE BIO SURGEON STRL SZ7 (GLOVE) ×2 IMPLANT
GLOVE BIO SURGEON STRL SZ7.5 (GLOVE) ×3 IMPLANT
GLOVE BIO SURGEONS STRL SZ 6.5 (GLOVE)
GLOVE BIOGEL PI IND STRL 6.5 (GLOVE) IMPLANT
GLOVE BIOGEL PI IND STRL 7.0 (GLOVE) IMPLANT
GLOVE BIOGEL PI INDICATOR 6.5 (GLOVE)
GLOVE BIOGEL PI INDICATOR 7.0 (GLOVE)
GOWN STRL REUS W/ TWL LRG LVL3 (GOWN DISPOSABLE) ×1 IMPLANT
GOWN STRL REUS W/ TWL XL LVL3 (GOWN DISPOSABLE) ×1 IMPLANT
GOWN STRL REUS W/TWL LRG LVL3 (GOWN DISPOSABLE) ×6
GOWN STRL REUS W/TWL XL LVL3 (GOWN DISPOSABLE) ×3
KIT BASIN OR (CUSTOM PROCEDURE TRAY) ×3 IMPLANT
KIT ROOM TURNOVER OR (KITS) ×3 IMPLANT
NEEDLE 22X1 1/2 (OR ONLY) (NEEDLE) ×6 IMPLANT
NS IRRIG 1000ML POUR BTL (IV SOLUTION) ×3 IMPLANT
PAD ARMBOARD 7.5X6 YLW CONV (MISCELLANEOUS) ×3 IMPLANT
SPONGE SURGIFOAM ABS GEL 12-7 (HEMOSTASIS) IMPLANT
SUT CHROMIC 3 0 PS 2 (SUTURE) ×5 IMPLANT
SYR CONTROL 10ML LL (SYRINGE) ×4 IMPLANT
TRAY ENT MC OR (CUSTOM PROCEDURE TRAY) ×3 IMPLANT
TUBING IRRIGATION (MISCELLANEOUS) ×3 IMPLANT
YANKAUER SUCT BULB TIP NO VENT (SUCTIONS) ×3 IMPLANT

## 2017-02-28 NOTE — Anesthesia Preprocedure Evaluation (Addendum)
Anesthesia Evaluation  Patient identified by MRN, date of birth, ID band Patient awake    Reviewed: Allergy & Precautions, NPO status , Patient's Chart, lab work & pertinent test results  History of Anesthesia Complications Negative for: history of anesthetic complications  Airway Mallampati: III  TM Distance: >3 FB Neck ROM: Full    Dental  (+) Teeth Intact   Pulmonary neg shortness of breath, neg sleep apnea, neg COPD, neg recent URI, former smoker,    breath sounds clear to auscultation- rhonchi       Cardiovascular negative cardio ROS   Rhythm:Regular     Neuro/Psych  Headaches, PSYCHIATRIC DISORDERS    GI/Hepatic negative GI ROS, Neg liver ROS,   Endo/Other  Morbid obesity  Renal/GU negative Renal ROS     Musculoskeletal negative musculoskeletal ROS (+)   Abdominal   Peds  Hematology negative hematology ROS (+)   Anesthesia Other Findings   Reproductive/Obstetrics                            Anesthesia Physical Anesthesia Plan  ASA: II  Anesthesia Plan: General   Post-op Pain Management:    Induction: Intravenous  PONV Risk Score and Plan: 3 and Ondansetron and Treatment may vary due to age or medical condition  Airway Management Planned: Nasal ETT  Additional Equipment: None  Intra-op Plan:   Post-operative Plan: Extubation in OR  Informed Consent: I have reviewed the patients History and Physical, chart, labs and discussed the procedure including the risks, benefits and alternatives for the proposed anesthesia with the patient or authorized representative who has indicated his/her understanding and acceptance.   Dental advisory given  Plan Discussed with: CRNA and Surgeon  Anesthesia Plan Comments:         Anesthesia Quick Evaluation

## 2017-02-28 NOTE — Transfer of Care (Signed)
Immediate Anesthesia Transfer of Care Note  Patient: Jessica Massey  Procedure(s) Performed: DENTAL EXTRACTIONS IMPACTED TEETH NUMBER SIXTEEN, SEVENTEEN, THIRTY-TWO (N/A Mouth)  Patient Location: PACU  Anesthesia Type:General  Level of Consciousness: awake, alert  and oriented  Airway & Oxygen Therapy: Patient Spontanous Breathing  Post-op Assessment: Report given to RN and Post -op Vital signs reviewed and stable  Post vital signs: Reviewed and stable  Last Vitals:  Vitals:   02/28/17 0613  BP: 140/87  Pulse: 74  Resp: 18  Temp: 36.7 C  SpO2: 99%    Last Pain:  Vitals:   02/28/17 0613  TempSrc: Oral         Complications: No apparent anesthesia complications

## 2017-02-28 NOTE — H&P (Signed)
HISTORY AND PHYSICAL  Jessica Massey is a 27 y.o. female patient with CC: painful wisdom teeth  No diagnosis found.  Past Medical History:  Diagnosis Date  . Constipation   . Dyspnea    with exertion  . Headache    migraine-   . Optic disc edema   . Pseudotumor cerebri 01/16/2016    Current Facility-Administered Medications  Medication Dose Route Frequency Provider Last Rate Last Dose  . ceFAZolin (ANCEF) 3 g in dextrose 5 % 50 mL IVPB  3 g Intravenous To SS-Surg Ocie DoyneJensen, Tahjanae Blankenburg, DDS      . lactated ringers infusion   Intravenous Continuous Arta Brucessey, Kevin, MD 10 mL/hr at 02/28/17 [id number]     Allergies  Allergen Reactions  . Latex Anaphylaxis and Hives   Active Problems:   * No active hospital problems. *  Vitals: Blood pressure 140/87, pulse 74, temperature 98 F (36.7 C), temperature source Oral, resp. rate 18, height 5\' 2"  (1.575 m), weight (!) 322 lb 12.1 oz (146.4 kg), last menstrual period 02/13/2017, SpO2 99 %. Lab results:No results found for this or any previous visit (from the past 24 hour(s)). Radiology Results: Dg Ugi W/high Density W/kub  Result Date: 02/27/2017 CLINICAL DATA:  Preoperative clearance EXAM: UPPER GI SERIES WITH KUB TECHNIQUE: After obtaining a scout radiograph a routine upper GI series was performed using thin and high density barium. FLUOROSCOPY TIME:  Fluoroscopy Time:  1 minutes 36 second Radiation Exposure Index (if provided by the fluoroscopic device): Number of Acquired Spot Images: 0 COMPARISON:  None. FINDINGS: Preliminary KUB negative. Normal bowel gas pattern. Surgical clip in the right mid abdomen. Moderate stool in the right colon Esophageal mucosa and motility normal. Negative for ulcer or mass or stricture. No hiatal hernia or reflux identified Gastric mucosa is smooth and normal.  No ulcer or mass or edema Duodenal bulb is normal IMPRESSION: Negative PLEASE PICK CORRECT UPPER GI TEMPLATE Electronically Signed   By: Marlan Palauharles  Clark M.D.   On:  02/27/2017 09:14   General appearance: alert, cooperative and morbidly obese Head: Normocephalic, without obvious abnormality, atraumatic Eyes: negative Nose: Nares normal. Septum midline. Mucosa normal. No drainage or sinus tenderness. Throat: lips, mucosa, and tongue normal; teeth and gums normal Neck: no adenopathy, supple, symmetrical, trachea midline and thyroid not enlarged, symmetric, no tenderness/mass/nodules Resp: clear to auscultation bilaterally Cardio: regular rate and rhythm, S1, S2 normal, no murmur, click, rub or gallop  Assessment: Impacted wisdom teeth. Morbid obesity  Plan: extraction impacted wisdom teeth with GA. Nasal. Day surgery.   Ocie DoyneScott Timeka Goette 02/28/2017

## 2017-02-28 NOTE — Anesthesia Postprocedure Evaluation (Signed)
Anesthesia Post Note  Patient: Jessica Massey  Procedure(s) Performed: DENTAL EXTRACTIONS IMPACTED TEETH NUMBER SIXTEEN, SEVENTEEN, THIRTY-TWO (N/A Mouth)     Patient location during evaluation: PACU Anesthesia Type: General Level of consciousness: awake and alert Pain management: pain level controlled Vital Signs Assessment: post-procedure vital signs reviewed and stable Respiratory status: spontaneous breathing, nonlabored ventilation, respiratory function stable and patient connected to nasal cannula oxygen Cardiovascular status: blood pressure returned to baseline and stable Postop Assessment: no apparent nausea or vomiting Anesthetic complications: no    Last Vitals:  Vitals:   02/28/17 0948 02/28/17 0957  BP: (!) 131/92 (!) 151/95  Pulse: 94 92  Resp: 18 18  Temp: 36.9 C   SpO2: 97% 98%    Last Pain:  Vitals:   02/28/17 0948  TempSrc:   PainSc: 0-No pain                 Adelise Buswell

## 2017-02-28 NOTE — Op Note (Signed)
02/28/2017  9:09 AM  PATIENT:  Earnest ConroyBrooke M Laverdiere  27 y.o. female  PRE-OPERATIVE DIAGNOSIS:  NON RESTORABLE TOOTH # 16, IMPACTED TEETH # 17, 32  POST-OPERATIVE DIAGNOSIS:  SAME  PROCEDURE:  Procedure(s): DENTAL EXTRACTIONS IMPACTED TEETH NUMBER SIXTEEN, SEVENTEEN, THIRTY-TWO  SURGEON:  Surgeon(s): Ocie DoyneJensen, Achol Azpeitia, DDS  ANESTHESIA:   local and general  EBL:  minimal  DRAINS: none   SPECIMEN:  No Specimen  COUNTS:  YES  PLAN OF CARE: Discharge to home after PACU  PATIENT DISPOSITION:  PACU - hemodynamically stable.   PROCEDURE DETAILS: Dictation # [id number]  Georgia LopesScott M. Rishik Tubby, DMD 02/28/2017 9:09 AM

## 2017-02-28 NOTE — Anesthesia Procedure Notes (Signed)
Procedure Name: Intubation Date/Time: 02/28/2017 8:42 AM Performed by: Lowella Dell, CRNA Pre-anesthesia Checklist: Patient identified, Emergency Drugs available, Suction available and Patient being monitored Patient Re-evaluated:Patient Re-evaluated prior to induction Oxygen Delivery Method: Circle System Utilized Preoxygenation: Pre-oxygenation with 100% oxygen Induction Type: IV induction Ventilation: Mask ventilation without difficulty and Nasal airway inserted- appropriate to patient size Laryngoscope Size: Mac and 4 Grade View: Grade I Nasal Tubes: Right, Magill forceps - small, utilized, Nasal prep performed and Nasal Rae Tube size: 7.0 mm Number of attempts: 1 Airway Equipment and Method: Stylet and Oral airway Placement Confirmation: ETT inserted through vocal cords under direct vision,  positive ETCO2 and breath sounds checked- equal and bilateral Secured at: 28 cm Tube secured with: Tape Dental Injury: Teeth and Oropharynx as per pre-operative assessment

## 2017-03-01 ENCOUNTER — Encounter (HOSPITAL_COMMUNITY): Payer: Self-pay | Admitting: Oral Surgery

## 2017-03-03 NOTE — Op Note (Signed)
NAME:  Jessica Massey, [name]             ACCOUNT NO.:  [id number]  MEDICAL RECORD NO.:  [id number]  LOCATION:                                 FACILITY:  PHYSICIAN:  Georgia LopesScott M. Keilynn Marano, M.D.       DATE OF BIRTH:  DATE OF PROCEDURE:  02/28/2017 DATE OF DISCHARGE:                              OPERATIVE REPORT   PREOPERATIVE DIAGNOSES:  Nonrestorable tooth #16 secondary to severe dental caries, complete bony impacted teeth numbers 17 and 32.  POSTOPERATIVE DIAGNOSES:  Nonrestorable tooth #16 secondary to severe dental caries, complete bony impacted teeth numbers 17 and 32.  PROCEDURE:  Extraction of teeth numbers 16, 17, 32.  SURGEON:  Georgia LopesScott M. Rollyn Scialdone, M.D.  ANESTHESIA:  General nasal intubation.  PROCEDURE DESCRIPTION:  The patient was taken to the operating room, placed on the table in supine position.  General anesthesia was administered intravenously and a nasal endotracheal tube was placed and secured.  The eyes were protected and the patient was draped for surgery.  Time-out was performed.  The posterior pharynx was suctioned and a throat pack was placed.  Lidocaine 2% with 1:100,000 epinephrine was infiltrated in an inferior alveolar block on the right and left side and then buccal and palatal infiltration around tooth #16, total of 15 mL was utilized.  A bite block was placed in the right side of the mouth and left side was operated first.  A #15 blade was used to make an incision around tooth #16 and then overlying tooth #17 in a hockey-stick fashion.  The periosteum was reflected from around these teeth.  Bone was removed overlying tooth #17.  The tooth was sectioned into multiple pieces and removed using a 301 elevator.  Tooth #16 was elevated and removed using a 301 elevator and rongeurs.  Then, the sockets were curetted, irrigated, and closed with 3-0 chromic.  The bite block and sweetheart retractor were repositioned to the other side of the mouth and a 15 blade was used to  make an incision overlying tooth #32.  The periosteum was reflected and the bone was removed overlying the tooth using a Stryker handpiece.  The crown was sectioned in multiple pieces and the roots were sectioned and removed with a 301 elevator.  Then, the socket was curetted, irrigated and closed with 3-0 chromic.  The oral cavity was irrigated and suctioned.  The throat pack was removed.  The patient was left in care of anesthesia for awakening, transport to recovery room with plans for discharge same-day surgery.  ESTIMATED BLOOD LOSS:  Minimal.  COMPLICATIONS:  None.  SPECIMENS:  None.     Georgia LopesScott M. Farron Watrous, M.D.   ______________________________ Georgia LopesScott M. Maxton Noreen, M.D.    SMJ/MEDQ  D:  02/28/2017  T:  02/28/2017  Job:  [id number]

## 2017-04-16 LAB — OB RESULTS CONSOLE GC/CHLAMYDIA
Chlamydia: NEGATIVE
Gonorrhea: NEGATIVE

## 2017-04-16 LAB — OB RESULTS CONSOLE HIV ANTIBODY (ROUTINE TESTING): HIV: NONREACTIVE

## 2017-04-16 LAB — OB RESULTS CONSOLE RUBELLA ANTIBODY, IGM: RUBELLA: NON-IMMUNE/NOT IMMUNE

## 2017-04-16 LAB — OB RESULTS CONSOLE RPR: RPR: NONREACTIVE

## 2017-04-16 LAB — OB RESULTS CONSOLE ABO/RH: RH TYPE: POSITIVE

## 2017-04-16 LAB — OB RESULTS CONSOLE ANTIBODY SCREEN: Antibody Screen: NEGATIVE

## 2017-04-16 LAB — OB RESULTS CONSOLE HEPATITIS B SURFACE ANTIGEN: HEP B S AG: NEGATIVE

## 2017-08-05 ENCOUNTER — Encounter (HOSPITAL_COMMUNITY): Payer: Self-pay

## 2017-08-05 ENCOUNTER — Inpatient Hospital Stay (HOSPITAL_COMMUNITY)
Admission: AD | Admit: 2017-08-05 | Discharge: 2017-08-06 | Disposition: A | Discharge status: Home / Self Care | Payer: Medicaid Other | Source: Ambulatory Visit | Attending: Obstetrics and Gynecology | Admitting: Obstetrics and Gynecology

## 2017-08-05 DIAGNOSIS — O4690 Antepartum hemorrhage, unspecified, unspecified trimester: Secondary | ICD-10-CM | POA: Diagnosis not present

## 2017-08-05 DIAGNOSIS — Z3A25 25 weeks gestation of pregnancy: Secondary | ICD-10-CM | POA: Diagnosis not present

## 2017-08-05 DIAGNOSIS — O4692 Antepartum hemorrhage, unspecified, second trimester: Secondary | ICD-10-CM | POA: Diagnosis not present

## 2017-08-05 DIAGNOSIS — B373 Candidiasis of vulva and vagina: Secondary | ICD-10-CM | POA: Insufficient documentation

## 2017-08-05 DIAGNOSIS — O98812 Other maternal infectious and parasitic diseases complicating pregnancy, second trimester: Secondary | ICD-10-CM | POA: Insufficient documentation

## 2017-08-05 DIAGNOSIS — Z87891 Personal history of nicotine dependence: Secondary | ICD-10-CM | POA: Diagnosis not present

## 2017-08-05 DIAGNOSIS — B3731 Acute candidiasis of vulva and vagina: Secondary | ICD-10-CM

## 2017-08-05 LAB — WET PREP, GENITAL
Clue Cells Wet Prep HPF POC: NONE SEEN
Sperm: NONE SEEN
Trich, Wet Prep: NONE SEEN
Yeast Wet Prep HPF POC: NONE SEEN

## 2017-08-05 LAB — URINALYSIS, ROUTINE W REFLEX MICROSCOPIC
Bilirubin Urine: NEGATIVE
Glucose, UA: NEGATIVE mg/dL
Hgb urine dipstick: NEGATIVE
Ketones, ur: NEGATIVE mg/dL
Leukocytes, UA: NEGATIVE
Nitrite: NEGATIVE
Protein, ur: NEGATIVE mg/dL
Specific Gravity, Urine: 1.011 (ref 1.005–1.030)
pH: 6 (ref 5.0–8.0)

## 2017-08-05 MED ORDER — TERCONAZOLE 0.8 % VA CREA
1.0000 | TOPICAL_CREAM | Freq: Every day | VAGINAL | 0 refills | Status: DC
Start: 2017-08-05 — End: 2017-11-07

## 2017-08-05 NOTE — MAU Note (Signed)
Pt states she started having vaginal bleeding that started around 1830. States it is not as heavy as a period but is a little more than spotting. Denies passing clots. Pt denies pain but reports "feeling like everything is going to fall out". Denies recent intercourse or heavy lifting. Reports good fetal movement.

## 2017-08-05 NOTE — MAU Note (Signed)
PT SAYS SSHE HAD VAG BLEEDING  AT 630PM- SEX  WAS 3 DAYS AGO.

## 2017-08-05 NOTE — MAU Provider Note (Signed)
Chief Complaint:  Vaginal Bleeding   First Provider Initiated Contact with Patient 08/05/17 2251      HPI: Jessica Massey is a 27 y.o. [id number] at [id number] presents to maternity admissions reporting vaginal bleeding. She reports vaginal bleeding started today around 1830. She reports she was getting dressed to go to dinner and felt something in her underwear went to the restroom and noted pinkish red blood. She reports placing a panty liner and the bleeding being more than spotting but less than a period. She denies passing clots or tissue. Denies having to change pads after bleeding began. She denies abdominal pain, cramping or contractions prior to the bleeding or after bleeding started. She denies recent intercourse in the last 24 hours- reports last IC was 3 days ago. She reports good fetal movement, denies LOF,  vaginal itching/burning, urinary symptoms, h/a, dizziness, n/v, or fever/chills.  [facility] and next appointment is 08/25/17.  Past Medical History: Past Medical History:  Diagnosis Date  . Constipation   . Dyspnea    with exertion  . Headache    migraine-   . Optic disc edema   . Pseudotumor cerebri 01/16/2016    Past obstetric history: OB History  Gravida Para Term Preterm AB Living  4 2 2  0 0 2  SAB TAB Ectopic Multiple Live Births  0 0 0 0 2    # Outcome Date GA Lbr Len/2nd Weight Sex Delivery Anes PTL Lv  4 Current           3 Gravida           2 Term      Vag-Spont     1 Term      Vag-Spont       Past Surgical History: Past Surgical History:  Procedure Laterality Date  . APPENDECTOMY    . TOOTH EXTRACTION N/A 02/28/2017   Procedure: DENTAL EXTRACTIONS IMPACTED TEETH NUMBER SIXTEEN, SEVENTEEN, THIRTY-TWO;  Surgeon: Ocie DoyneJensen, Scott, DDS;  Location: [facility] OR;  Service: Oral Surgery;  Laterality: N/A;    Family History: Family History  Problem Relation Age of Onset  . Hypertension Mother   . Diabetes Mother   . Aneurysm Father    . High blood pressure Unknown   . Breast cancer Maternal Grandmother   . Asthma Brother   . Spina bifida Brother     Social History: Social History   Tobacco Use  . Smoking status: Former Smoker    Packs/day: 0.25    Types: Cigarettes    Last attempt to quit: 12/2016    Years since quitting: 0.6  . Smokeless tobacco: Never Used  . Tobacco comment: 10  Substance Use Topics  . Alcohol use: Yes    Alcohol/week: 0.6 oz    Types: 1 Shots of liquor per week    Comment: occasional social- 2 drinks  2 times a month- 02/27/17  . Drug use: No    Allergies:  Allergies  Allergen Reactions  . Latex Anaphylaxis and Hives    Meds:  Medications Prior to Admission  Medication Sig Dispense Refill Last Dose  . polyethylene glycol (MIRALAX / GLYCOLAX) packet Take 17 g by mouth daily as needed for moderate constipation.   Past Month at Unknown time  . amoxicillin (AMOXIL) 500 MG capsule Take 1 capsule (500 mg total) by mouth 3 (three) times daily. 21 capsule 0   . oxyCODONE-acetaminophen (PERCOCET) 5-325 MG tablet Take 1 tablet by mouth every 6 (six)  hours as needed for severe pain. 20 tablet 0     ROS:  Review of Systems  Respiratory: Negative.   Cardiovascular: Negative.   Gastrointestinal: Negative.   Genitourinary: Positive for vaginal bleeding. Negative for difficulty urinating, dysuria, frequency, pelvic pain, urgency and vaginal pain.   I have reviewed patient's Past Medical Hx, Surgical Hx, Family Hx, Social Hx, medications and allergies.   Physical Exam   Patient Vitals for the past 24 hrs:  BP Temp Temp src Pulse Resp SpO2 Height Weight  08/06/17 0017 116/60 - - 79 - - - -  08/05/17 2221 112/66 98.3 F (36.8 C) Oral 78 20 99 % 5\' 2"  (1.575 m) (!) 325 lb (147.4 kg)   Constitutional: Well-developed, obese female in no acute distress.  Cardiovascular: normal rate Respiratory: normal effort GI: Abd soft, non-tender, gravid appropriate for gestational age.  MS: Extremities  nontender, no edema, normal ROM Neurologic: Alert and oriented x 4.  GU: Neg CVAT.  PELVIC EXAM: Cervix pink, visually closed, without lesion, moderate white curdy discharge noted around cervix and walls of vagina. NO VAGINAL BLEEDING NOTED. Bimanual exam: Cervix 0/long/high  FHT:  Baseline 135 , moderate variability, accelerations present (10x10), no decelerations Contractions: none    Labs: Results for orders placed or performed during the hospital encounter of 08/05/17 (from the past 24 hour(s))  Urinalysis, Routine w reflex microscopic     Status: None   Collection Time: 08/05/17 10:17 PM  Result Value Ref Range   Color, Urine YELLOW YELLOW   APPearance CLEAR CLEAR   Specific Gravity, Urine 1.011 1.005 - 1.030   pH 6.0 5.0 - 8.0   Glucose, UA NEGATIVE NEGATIVE mg/dL   Hgb urine dipstick NEGATIVE NEGATIVE   Bilirubin Urine NEGATIVE NEGATIVE   Ketones, ur NEGATIVE NEGATIVE mg/dL   Protein, ur NEGATIVE NEGATIVE mg/dL   Nitrite NEGATIVE NEGATIVE   Leukocytes, UA NEGATIVE NEGATIVE  Wet prep, genital     Status: Abnormal   Collection Time: 08/05/17 11:04 PM  Result Value Ref Range   Yeast Wet Prep HPF POC NONE SEEN NONE SEEN   Trich, Wet Prep NONE SEEN NONE SEEN   Clue Cells Wet Prep HPF POC NONE SEEN NONE SEEN   WBC, Wet Prep HPF POC MANY (A) NONE SEEN   Sperm NONE SEEN       Imaging:  Bedside ultrasound performed by me with supervision of Wynelle Bourgeois CNM   Pt informed that the ultrasound is considered a limited OB ultrasound and is not intended to be a complete ultrasound exam.  Patient also informed that the ultrasound is not being completed with the intent of assessing for fetal or placental anomalies or any pelvic abnormalities.  Explained that the purpose of today's ultrasound is to assess for  heart rate, movement, and presentation.  Patient acknowledges the purpose of the exam and the limitations of the study.    MAU Course/MDM: Orders Placed This Encounter   Procedures  . Wet prep, genital  . Urinalysis, Routine w reflex microscopic  . Discharge patient Discharge disposition: 01-Home or Self Care; Discharge patient date: 08/05/2017   Wet prep- negative, will treat for yeast based on clinical symptoms and physical findings.  UA- negative   Meds ordered this encounter  Medications  . terconazole (TERAZOL 3) 0.8 % vaginal cream    Sig: Place 1 applicator vaginally at bedtime.    Dispense:  20 g    Refill:  0    Order Specific Question:  Supervising Provider    Answer:   [place] [[id number]]    NST reviewed- reactive for gestational age  Consult Dr Mindi Slicker with presentation, exam findings and test results. Okay to discharge home- recommends treatment of yeast infection and education on monitoring for bleeding over the next 24 hours, if vaginal bleeding occurs call the office to scheduled closer appointment to be seen.   Pt discharge with strict bleeding precautions and pelvic rest instructions. Patient verbalizes understanding.   Assessment: 1. Vaginal bleeding during pregnancy, antepartum   2. Vaginal bleeding during pregnancy   3. Vaginal yeast infection   4. [redacted] weeks gestation of pregnancy     Plan: Discharge home Preterm Labor precautions and fetal kick counts Monitor for vaginal bleeding over the course of 24 hours  Call office to schedule appointment for this week if second occurrence of bleeding happens  Discussed reasons to return to MAU as needed  Rx for Terazol sent to pharmacy of choice   Follow-up Information    [facility] Follow up.   Why:  Make appointment to be seen if you have another episode of vaginal bleeding  Contact information: [address]  [address] [place] [address] [phone]           Allergies as of 08/05/2017      Reactions   Latex Anaphylaxis, Hives      Medication List    STOP taking these medications   amoxicillin 500 MG capsule Commonly known as:   AMOXIL   oxyCODONE-acetaminophen 5-325 MG tablet Commonly known as:  PERCOCET     TAKE these medications   polyethylene glycol packet Commonly known as:  MIRALAX / GLYCOLAX Take 17 g by mouth daily as needed for moderate constipation.   terconazole 0.8 % vaginal cream Commonly known as:  TERAZOL 3 Place 1 applicator vaginally at bedtime.       Steward Drone Certified Nurse-Midwife 08/05/2017 11:54 PM

## 2017-08-05 NOTE — Discharge Instructions (Signed)
Pelvic Rest °Pelvic rest may be recommended if: °· Your placenta is partially or completely covering the opening of your cervix (placenta previa). °· There is bleeding between the wall of the uterus and the amniotic sac in the first trimester of pregnancy (subchorionic hemorrhage). °· You went into labor too early (preterm labor). ° °Based on your overall health and the health of your baby, your health care provider will decide if pelvic rest is right for you. °How do I rest my pelvis? °For as long as told by your health care provider: °· Do not have sex, sexual stimulation, or an orgasm. °· Do not use tampons. Do not douche. Do not put anything in your vagina. °· Do not lift anything that is heavier than 10 lb (4.5 kg). °· Avoid activities that take a lot of effort (are strenuous). °· Avoid any activity in which your pelvic muscles could become strained. ° °When should I seek medical care? °Seek medical care if you have: °· Cramping pain in your lower abdomen. °· Vaginal discharge. °· A low, dull backache. °· Regular contractions. °· Uterine tightening. ° °When should I seek immediate medical care? °Seek immediate medical care if: °· You have vaginal bleeding and you are pregnant. ° °This information is not intended to replace advice given to you by your health care provider. Make sure you discuss any questions you have with your health care provider. °Document Released: 06/08/2010 Document Revised: 07/20/2015 Document Reviewed: 08/15/2014 °Elsevier Interactive Patient Education © 2018 [employer] ° °

## 2017-08-07 LAB — GC/CHLAMYDIA PROBE AMP (~~LOC~~) NOT AT ARMC
Chlamydia: NEGATIVE
Neisseria Gonorrhea: NEGATIVE

## 2017-09-10 ENCOUNTER — Encounter: Payer: Medicaid Other | Attending: Obstetrics and Gynecology | Admitting: Registered"

## 2017-09-10 DIAGNOSIS — O99810 Abnormal glucose complicating pregnancy: Secondary | ICD-10-CM | POA: Insufficient documentation

## 2017-09-10 DIAGNOSIS — Z713 Dietary counseling and surveillance: Secondary | ICD-10-CM | POA: Diagnosis not present

## 2017-09-10 DIAGNOSIS — Z3A00 Weeks of gestation of pregnancy not specified: Secondary | ICD-10-CM | POA: Insufficient documentation

## 2017-09-15 ENCOUNTER — Encounter: Payer: Self-pay | Admitting: Registered"

## 2017-09-15 DIAGNOSIS — O99810 Abnormal glucose complicating pregnancy: Secondary | ICD-10-CM | POA: Insufficient documentation

## 2017-09-15 NOTE — Progress Notes (Signed)
Patient was seen on 09/10/2017 for Gestational Diabetes self-management class at the [facility]. The following learning objectives were met by the patient during this course:   States the definition of Gestational Diabetes  States why dietary management is important in controlling blood glucose  Describes the effects each nutrient has on blood glucose levels  Demonstrates ability to create a balanced meal plan  Demonstrates carbohydrate counting   States when to check blood glucose levels  Demonstrates proper blood glucose monitoring techniques  States the effect of stress and exercise on blood glucose levels  States the importance of limiting caffeine and abstaining from alcohol and smoking  Blood glucose monitor given: Accu Chek Guide Lot # [id number] Exp: 06/10/2018 Blood glucose reading: 124  Patient instructed to monitor glucose levels: FBS: 60 - <95; 1 hour: <140; 2 hour: <120  Patient received handouts:  Nutrition Diabetes and Pregnancy, including carb counting list  Patient will be seen for follow-up as needed.

## 2017-10-23 LAB — OB RESULTS CONSOLE GBS: GBS: NEGATIVE

## 2017-10-31 ENCOUNTER — Encounter (HOSPITAL_COMMUNITY): Payer: Self-pay | Admitting: *Deleted

## 2017-10-31 ENCOUNTER — Telehealth (HOSPITAL_COMMUNITY): Payer: Self-pay | Admitting: *Deleted

## 2017-10-31 NOTE — Telephone Encounter (Signed)
Preadmission screen  

## 2017-11-03 ENCOUNTER — Encounter (HOSPITAL_COMMUNITY): Payer: Self-pay | Admitting: *Deleted

## 2017-11-07 ENCOUNTER — Inpatient Hospital Stay (HOSPITAL_BASED_OUTPATIENT_CLINIC_OR_DEPARTMENT_OTHER): Payer: Medicaid Other

## 2017-11-07 ENCOUNTER — Encounter (HOSPITAL_COMMUNITY): Payer: Self-pay | Admitting: *Deleted

## 2017-11-07 ENCOUNTER — Inpatient Hospital Stay (HOSPITAL_COMMUNITY)
Admission: AD | Admit: 2017-11-07 | Discharge: 2017-11-07 | Disposition: A | Discharge status: Home / Self Care | Payer: Medicaid Other | Source: Ambulatory Visit | Attending: Obstetrics and Gynecology | Admitting: Obstetrics and Gynecology

## 2017-11-07 DIAGNOSIS — Z3689 Encounter for other specified antenatal screening: Secondary | ICD-10-CM

## 2017-11-07 DIAGNOSIS — O98813 Other maternal infectious and parasitic diseases complicating pregnancy, third trimester: Secondary | ICD-10-CM | POA: Diagnosis not present

## 2017-11-07 DIAGNOSIS — Z3A38 38 weeks gestation of pregnancy: Secondary | ICD-10-CM

## 2017-11-07 DIAGNOSIS — B3731 Acute candidiasis of vulva and vagina: Secondary | ICD-10-CM

## 2017-11-07 DIAGNOSIS — O9989 Other specified diseases and conditions complicating pregnancy, childbirth and the puerperium: Secondary | ICD-10-CM

## 2017-11-07 DIAGNOSIS — O289 Unspecified abnormal findings on antenatal screening of mother: Secondary | ICD-10-CM | POA: Diagnosis not present

## 2017-11-07 DIAGNOSIS — B373 Candidiasis of vulva and vagina: Secondary | ICD-10-CM

## 2017-11-07 DIAGNOSIS — O24415 Gestational diabetes mellitus in pregnancy, controlled by oral hypoglycemic drugs: Secondary | ICD-10-CM | POA: Insufficient documentation

## 2017-11-07 DIAGNOSIS — Z87891 Personal history of nicotine dependence: Secondary | ICD-10-CM | POA: Diagnosis not present

## 2017-11-07 LAB — WET PREP, GENITAL
Clue Cells Wet Prep HPF POC: NONE SEEN
SPERM: NONE SEEN
TRICH WET PREP: NONE SEEN

## 2017-11-07 LAB — AMNISURE RUPTURE OF MEMBRANE (ROM) NOT AT ARMC: Amnisure ROM: NEGATIVE

## 2017-11-07 MED ORDER — TERCONAZOLE 0.4 % VA CREA: 1.0000 | TOPICAL_CREAM | Freq: Every day | VAGINAL | 0 refills | Status: AC

## 2017-11-07 NOTE — MAU Provider Note (Signed)
History     CSN: [id number]  Arrival date and time: 11/07/17 1704   First Provider Initiated Contact with Patient 11/07/17 1718      Chief Complaint  Patient presents with  . Pregnancy Ultrasound   Jessica Massey is a 27 y.o. G3P2002 at [redacted]w[redacted]d who presents today from the office for a BPP. She is in antenatal testing 2/2 GDM, on metformin. She states that for the last 3 weeks she has "failed the NST". She was sent from the office today because she "failed the NST" in the office. She denies any contractions or VB. She reports normal fetal movement. She has been leaking fluid since 9/11 and she was seen in the office for this. She was told it was not amniotic fluid.    OB History    Gravida  3   Para  2   Term  2   Preterm  0   AB  0   Living  2     SAB  0   TAB  0   Ectopic  0   Multiple  0   Live Births  2           Past Medical History:  Diagnosis Date  . Constipation   . Dyspnea    with exertion  . Gestational diabetes   . Headache    migraine-   . Optic disc edema   . Pseudotumor cerebri 01/16/2016    Past Surgical History:  Procedure Laterality Date  . APPENDECTOMY    . TOOTH EXTRACTION N/A 02/28/2017   Procedure: DENTAL EXTRACTIONS IMPACTED TEETH NUMBER SIXTEEN, SEVENTEEN, THIRTY-TWO;  Surgeon: Ocie Doyne, DDS;  Location: [facility] OR;  Service: Oral Surgery;  Laterality: N/A;    Family History  Problem Relation Age of Onset  . Hypertension Mother   . Diabetes Mother   . Aneurysm Father   . High blood pressure Unknown   . Breast cancer Maternal Grandmother   . Asthma Brother   . Spina bifida Brother     Social History   Tobacco Use  . Smoking status: Former Smoker    Packs/day: 0.25    Types: Cigarettes    Last attempt to quit: 12/2016    Years since quitting: 0.8  . Smokeless tobacco: Never Used  . Tobacco comment: 10  Substance Use Topics  . Alcohol use: Yes    Alcohol/week: 1.0 standard drinks    Types: 1 Shots of liquor per  week    Comment: occasional social- 2 drinks  2 times a month- 02/27/17  . Drug use: No    Allergies:  Allergies  Allergen Reactions  . Latex Anaphylaxis and Hives    Medications Prior to Admission  Medication Sig Dispense Refill Last Dose  . polyethylene glycol (MIRALAX / GLYCOLAX) packet Take 17 g by mouth daily as needed for moderate constipation.   Past Month at Unknown time  . terconazole (TERAZOL 3) 0.8 % vaginal cream Place 1 applicator vaginally at bedtime. 20 g 0     Review of Systems  All other systems reviewed and are negative.  Physical Exam   Temperature 98.5 F (36.9 C), resp. rate 18, height 5\' 2"  (1.575 m), weight (!) 152.9 kg, last menstrual period 02/13/2017.  Physical Exam  Nursing note and vitals reviewed. Constitutional: She is oriented to person, place, and time. She appears well-developed and well-nourished. No distress.  HENT:  Head: Normocephalic.  Cardiovascular: Normal rate.  Respiratory: Effort normal.  GI: Soft. There  is no tenderness. There is no rebound.  Neurological: She is alert and oriented to person, place, and time.  Skin: Skin is warm and dry.  Psychiatric: She has a normal mood and affect.   NST:  Baseline: 135 Variability: moderate Accels: 15x15 Decels: none Toco: none   Results for orders placed or performed during the hospital encounter of 11/07/17 (from the past 24 hour(s))  Amnisure rupture of membrane (rom)not at [facility]     Status: None   Collection Time: 11/07/17  5:40 PM  Result Value Ref Range   Amnisure ROM NEGATIVE   Wet prep, genital     Status: Abnormal   Collection Time: 11/07/17  5:40 PM  Result Value Ref Range   Yeast Wet Prep HPF POC PRESENT (A) NONE SEEN   Trich, Wet Prep NONE SEEN NONE SEEN   Clue Cells Wet Prep HPF POC NONE SEEN NONE SEEN   WBC, Wet Prep HPF POC MODERATE (A) NONE SEEN   Sperm NONE SEEN     MAU Course  Procedures  MDM  6:51 PM consult with Dr. Mindi SlickerBanga, reviewed NST and BPP results.  Patient is ok for DC home, and keep scheduled induction for 9/17. Return precautions discussed and fetal kick counts discussed.  Assessment and Plan   1. NST (non-stress test) reactive   2. Yeast infection involving the vagina and surrounding area   3. [redacted] weeks gestation of pregnancy    DC home Comfort measures reviewed  3rd Trimester precautions  labor precautions  Fetal kick counts RX: terazol 7 as directed no RF  Return to MAU as needed FU with OB as planned  Follow-up Information    Edwinna AreolaBanga, Cecilia Worema, DO Follow up.   Specialty:  Obstetrics and Gynecology Contact information: [address] [place] [address] [phone]            Thressa ShellerHeather Hogan 11/07/2017, 5:26 PM

## 2017-11-07 NOTE — MAU Note (Signed)
Pt sent from office . Non-reactive NST.(gestational DM.) Here for BPP.

## 2017-11-07 NOTE — Discharge Instructions (Signed)

## 2017-11-10 NOTE — H&P (Signed)
Jessica Massey is a [age] y.o. female G3P2002 at 5539 3/7 weeks (EDD 11/15/17 by LMP c/w 9 week US)  presenting for IOL at term.  Prenatal care complicated by GDM well-controlled on metformin 1000mg  po BID and morbid obesity. She measured size greater than dates but difficult to assess with habitus so US obtained with AGA apperaing fetus at the 55%%ile at 36 weeks.  OB History    Gravida  3   Para  2   Term  2   Preterm  0   AB  0   Living  2     SAB  0   TAB  0   Ectopic  0   Multiple  0   Live Births  2         2010 NSVD 7#12oz 2011 NSVD 8#10oz  Past Medical History:  Diagnosis Date  . Constipation   . Dyspnea    with exertion  . Gestational diabetes   . Headache    migraine-   . Optic disc edema   . Pseudotumor cerebri 01/16/2016   Past Surgical History:  Procedure Laterality Date  . APPENDECTOMY    . TOOTH EXTRACTION N/A 02/28/2017   Procedure: DENTAL EXTRACTIONS IMPACTED TEETH NUMBER SIXTEEN, SEVENTEEN, THIRTY-TWO;  Surgeon: Ocie DoyneJensen, Scott, DDS;  Location: [facility] OR;  Service: Oral Surgery;  Laterality: N/A;   Family History: family history includes Aneurysm in her father; Asthma in her brother; Breast cancer in her maternal grandmother; Diabetes in her mother; High blood pressure in her unknown relative; Hypertension in her mother; Spina bifida in her brother. Social History:  reports that she quit smoking about 10 months ago. Her smoking use included cigarettes. She smoked 0.25 packs per day. She has never used smokeless tobacco. She reports that she drank about 1.0 standard drinks of alcohol per week. She reports that she does not use drugs.     Maternal Diabetes: Yes:  Diabetes Type:  Insulin/Medication controlled Genetic Screening: Normal Maternal Ultrasounds/Referrals: Normal Fetal Ultrasounds or other Referrals:  None Maternal Substance Abuse:  No Significant Maternal Medications:  Meds include: Other: metformin Significant Maternal Lab Results:   None Other Comments:  None  Review of Systems  Constitutional: Negative for fever.  Cardiovascular: Negative for chest pain.  Gastrointestinal: Negative for abdominal pain.   Maternal Medical History:  Contractions: Frequency: irregular.   Perceived severity is mild.    Fetal activity: Perceived fetal activity is normal.    Prenatal Complications - Diabetes: gestational. Diabetes is managed by oral agent (monotherapy).        Last menstrual period 02/13/2017. Exam Physical Exam  Prenatal labs: ABO, Rh: A/Positive/-- (02/20 0000) Antibody: Negative (02/20 0000) Rubella: Nonimmune (02/20 0000) RPR: Nonreactive (02/20 0000)  HBsAg: Negative (02/20 0000)  HIV: Non-reactive (02/20 0000)  GBS: Negative (08/29 0000)  One hour GCT 166 Three hour GTT with 3 abnormals First trimester screen and AFP negative  Assessment/Plan: Pt admitted for IOL at term Plan AROM and pitocin Will check fasting CBG in AM and if WNL, will not need other checks   Jessica Massey 11/10/2017, 8:56 PM

## 2017-11-11 ENCOUNTER — Encounter (HOSPITAL_COMMUNITY): Payer: Self-pay

## 2017-11-11 ENCOUNTER — Inpatient Hospital Stay (HOSPITAL_COMMUNITY)
Admission: RE | Admit: 2017-11-11 | Discharge: 2017-11-13 | DRG: 807 | Disposition: A | Discharge status: Home / Self Care | Payer: Medicaid Other | Attending: Obstetrics and Gynecology | Admitting: Obstetrics and Gynecology

## 2017-11-11 DIAGNOSIS — O24419 Gestational diabetes mellitus in pregnancy, unspecified control: Secondary | ICD-10-CM | POA: Diagnosis present

## 2017-11-11 DIAGNOSIS — Z3A39 39 weeks gestation of pregnancy: Secondary | ICD-10-CM | POA: Diagnosis not present

## 2017-11-11 DIAGNOSIS — Z87891 Personal history of nicotine dependence: Secondary | ICD-10-CM | POA: Diagnosis not present

## 2017-11-11 DIAGNOSIS — O99214 Obesity complicating childbirth: Secondary | ICD-10-CM | POA: Diagnosis present

## 2017-11-11 DIAGNOSIS — O24425 Gestational diabetes mellitus in childbirth, controlled by oral hypoglycemic drugs: Secondary | ICD-10-CM | POA: Diagnosis present

## 2017-11-11 DIAGNOSIS — Z3483 Encounter for supervision of other normal pregnancy, third trimester: Secondary | ICD-10-CM | POA: Diagnosis present

## 2017-11-11 LAB — CBC
HCT: 34.9 % — ABNORMAL LOW (ref 36.0–46.0)
HEMOGLOBIN: 11.1 g/dL — AB (ref 12.0–15.0)
MCH: 25.1 pg — ABNORMAL LOW (ref 26.0–34.0)
MCHC: 31.8 g/dL (ref 30.0–36.0)
MCV: 78.8 fL (ref 78.0–100.0)
Platelets: 288 10*3/uL (ref 150–400)
RBC: 4.43 MIL/uL (ref 3.87–5.11)
RDW: 14.4 % (ref 11.5–15.5)
WBC: 12.6 10*3/uL — AB (ref 4.0–10.5)

## 2017-11-11 LAB — GLUCOSE, CAPILLARY
GLUCOSE-CAPILLARY: 129 mg/dL — AB (ref 70–99)
Glucose-Capillary: 65 mg/dL — ABNORMAL LOW (ref 70–99)

## 2017-11-11 LAB — TYPE AND SCREEN
ABO/RH(D): A POS
ANTIBODY SCREEN: NEGATIVE

## 2017-11-11 LAB — RPR: RPR: NONREACTIVE

## 2017-11-11 LAB — ABO/RH: ABO/RH(D): A POS

## 2017-11-11 MED ORDER — DIPHENHYDRAMINE HCL 25 MG PO CAPS: 25.0000 mg | ORAL_CAPSULE | Freq: Four times a day (QID) | ORAL | Status: DC | PRN

## 2017-11-11 MED ORDER — ZOLPIDEM TARTRATE 5 MG PO TABS: 5.0000 mg | ORAL_TABLET | Freq: Every evening | ORAL | Status: DC | PRN

## 2017-11-11 MED ORDER — PRENATAL MULTIVITAMIN CH
1.0000 | ORAL_TABLET | Freq: Every day | ORAL | Status: DC
  Administered 2017-11-11 – 2017-11-12 (×2): 1 via ORAL
  Filled 2017-11-11 (×2): qty 1

## 2017-11-11 MED ORDER — SIMETHICONE 80 MG PO CHEW: 80.0000 mg | CHEWABLE_TABLET | ORAL | Status: DC | PRN

## 2017-11-11 MED ORDER — LIDOCAINE HCL (PF) 1 % IJ SOLN
30.0000 mL | INTRAMUSCULAR | Status: DC | PRN
  Filled 2017-11-11: qty 30

## 2017-11-11 MED ORDER — TETANUS-DIPHTH-ACELL PERTUSSIS 5-2.5-18.5 LF-MCG/0.5 IM SUSP: 0.5000 mL | Freq: Once | INTRAMUSCULAR | Status: DC

## 2017-11-11 MED ORDER — COCONUT OIL OIL: 1.0000 "application " | TOPICAL_OIL | Status: DC | PRN

## 2017-11-11 MED ORDER — ACETAMINOPHEN 325 MG PO TABS: 650.0000 mg | ORAL_TABLET | ORAL | Status: DC | PRN

## 2017-11-11 MED ORDER — IBUPROFEN 600 MG PO TABS
600.0000 mg | ORAL_TABLET | Freq: Four times a day (QID) | ORAL | Status: DC
  Administered 2017-11-11 – 2017-11-13 (×7): 600 mg via ORAL
  Filled 2017-11-11 (×7): qty 1

## 2017-11-11 MED ORDER — OXYTOCIN 40 UNITS IN LACTATED RINGERS INFUSION - SIMPLE MED: 2.5000 [IU]/h | INTRAVENOUS | Status: DC

## 2017-11-11 MED ORDER — ONDANSETRON HCL 4 MG PO TABS: 4.0000 mg | ORAL_TABLET | ORAL | Status: DC | PRN

## 2017-11-11 MED ORDER — SOD CITRATE-CITRIC ACID 500-334 MG/5ML PO SOLN: 30.0000 mL | ORAL | Status: DC | PRN

## 2017-11-11 MED ORDER — OXYTOCIN BOLUS FROM INFUSION
500.0000 mL | Freq: Once | INTRAVENOUS | Status: AC
  Administered 2017-11-11: 500 mL via INTRAVENOUS

## 2017-11-11 MED ORDER — SENNOSIDES-DOCUSATE SODIUM 8.6-50 MG PO TABS
2.0000 | ORAL_TABLET | ORAL | Status: DC
  Administered 2017-11-11 – 2017-11-12 (×2): 2 via ORAL
  Filled 2017-11-11 (×2): qty 2

## 2017-11-11 MED ORDER — LACTATED RINGERS IV SOLN: 500.0000 mL | INTRAVENOUS | Status: DC | PRN

## 2017-11-11 MED ORDER — OXYTOCIN 40 UNITS IN LACTATED RINGERS INFUSION - SIMPLE MED
1.0000 m[IU]/min | INTRAVENOUS | Status: DC
  Administered 2017-11-11: 2 m[IU]/min via INTRAVENOUS
  Filled 2017-11-11: qty 1000

## 2017-11-11 MED ORDER — OXYCODONE-ACETAMINOPHEN 5-325 MG PO TABS: 2.0000 | ORAL_TABLET | ORAL | Status: DC | PRN

## 2017-11-11 MED ORDER — WITCH HAZEL-GLYCERIN EX PADS: 1.0000 "application " | MEDICATED_PAD | CUTANEOUS | Status: DC | PRN

## 2017-11-11 MED ORDER — TERBUTALINE SULFATE 1 MG/ML IJ SOLN
0.2500 mg | Freq: Once | INTRAMUSCULAR | Status: DC | PRN
  Filled 2017-11-11: qty 1

## 2017-11-11 MED ORDER — ONDANSETRON HCL 4 MG/2ML IJ SOLN: 4.0000 mg | INTRAMUSCULAR | Status: DC | PRN

## 2017-11-11 MED ORDER — DIBUCAINE 1 % RE OINT: 1.0000 "application " | TOPICAL_OINTMENT | RECTAL | Status: DC | PRN

## 2017-11-11 MED ORDER — ONDANSETRON HCL 4 MG/2ML IJ SOLN: 4.0000 mg | Freq: Four times a day (QID) | INTRAMUSCULAR | Status: DC | PRN

## 2017-11-11 MED ORDER — FENTANYL CITRATE (PF) 100 MCG/2ML IJ SOLN: 50.0000 ug | INTRAMUSCULAR | Status: DC | PRN

## 2017-11-11 MED ORDER — BENZOCAINE-MENTHOL 20-0.5 % EX AERO: 1.0000 "application " | INHALATION_SPRAY | CUTANEOUS | Status: DC | PRN

## 2017-11-11 MED ORDER — OXYCODONE-ACETAMINOPHEN 5-325 MG PO TABS: 1.0000 | ORAL_TABLET | ORAL | Status: DC | PRN

## 2017-11-11 MED ORDER — LACTATED RINGERS IV SOLN
INTRAVENOUS | Status: DC
  Administered 2017-11-11: 09:00:00 via INTRAVENOUS

## 2017-11-11 NOTE — Anesthesia Pain Management Evaluation Note (Signed)
  [facility] Pain Management Visit Note  Patient: Jessica Massey, 27 y.o., female  "Hello I am a member of the anesthesia team at [facility]. We have an anesthesia team available at all times to provide care throughout the hospital, including epidural management and anesthesia for C-section. I don't know your plan for the delivery whether it a natural birth, water birth, IV sedation, nitrous supplementation, doula or epidural, but we want to meet your pain goals."   1.Was your pain managed to your expectations on prior hospitalizations?   Yes   2.What is your expectation for pain management during this hospitalization?     Planned Natural  3.How can we help you reach that goal? Elect natural birth  Record the patient's initial score and the patient's pain goal.   Pain: 4  Pain Goal: 3 [facility] wants you to be able to say your pain was always managed very well.  Jessica BoldDavid Adedayo [facility] 11/11/2017

## 2017-11-11 NOTE — Progress Notes (Signed)
Patient ID: Jessica Massey, female   DOB: 03/11/1990, 27 y.o.   MRN: [id number] Pt feeling minimal contractions afeb VSS FHR category 1  Ceriv 70/4-5/-2 AROM clear  Pt plans to labor without medications Follow progress

## 2017-11-12 ENCOUNTER — Other Ambulatory Visit: Payer: Self-pay

## 2017-11-12 LAB — CBC
HCT: 32.4 % — ABNORMAL LOW (ref 36.0–46.0)
Hemoglobin: 10.4 g/dL — ABNORMAL LOW (ref 12.0–15.0)
MCH: 25.2 pg — ABNORMAL LOW (ref 26.0–34.0)
MCHC: 32.1 g/dL (ref 30.0–36.0)
MCV: 78.6 fL (ref 78.0–100.0)
PLATELETS: 289 10*3/uL (ref 150–400)
RBC: 4.12 MIL/uL (ref 3.87–5.11)
RDW: 14.6 % (ref 11.5–15.5)
WBC: 15.3 10*3/uL — AB (ref 4.0–10.5)

## 2017-11-12 LAB — GLUCOSE, CAPILLARY
GLUCOSE-CAPILLARY: 117 mg/dL — AB (ref 70–99)
GLUCOSE-CAPILLARY: 107 mg/dL — AB (ref 70–99)
Glucose-Capillary: 87 mg/dL (ref 70–99)

## 2017-11-12 NOTE — Progress Notes (Signed)
Post Partum Day 1 Subjective: no complaints, up ad lib, tolerating PO and nl lochia, pain controlled  Objective: Blood pressure 138/82, pulse 70, temperature 98.2 F (36.8 C), temperature source Axillary, resp. rate 18, height 5\' 2"  (1.575 m), weight (!) 154.2 kg, last menstrual period 02/13/2017, SpO2 98 %, unknown if currently breastfeeding.  FSBS < 150  Physical Exam:  General: alert and no distress Lochia: appropriate Uterine Fundus: firm   Recent Labs    11/11/17 0813 11/12/17 0524  HGB 11.1* 10.4*  HCT 34.9* 32.4*    Assessment/Plan: Plan for discharge tomorrow, Breastfeeding and Lactation consult.  Routine PP care.  Stop FSBS after today   LOS: 1 day   Jessica Massey 11/12/2017, 8:56 AM

## 2017-11-12 NOTE — Lactation Note (Signed)
This note was copied from a baby's chart. Lactation Consultation Note  Patient Name: Jessica Massey ZOXWR'UToday's Date: 11/12/2017 Reason for consult: Initial assessment;Term P3, 19 hour female infant, Mom hx GDM in pregnancy. Per mom,  she doesnot have pump at home was given harmony manual pump by nurse. Per mom, infant had 2 wet and 3 soiled diapers. [facility] [place]  Per mom, she BF her son for 6 months and older daughter for 9 months. LC entered room infant was receiving bath. Mom demonstrated hand expression and expressed 2 ml of EBM that was given to infant after bath. Mom latched infant on left breast using football hold, infant had deep latch, audible swallows heard by LC. Mom did not need assistance w/ latch-10. LC discussed I&O. Mom encouraged to feed baby 8-12 times/24 hours and with feeding cues.  Reviewed Baby & Me book's Breastfeeding Basics.  LC discussed : LC outpatient clinic, BF support group, LC hotline and other BF resources within the local community.  Maternal Data Formula Feeding for Exclusion: No Has patient been taught Hand Expression?: Yes(Mom demostrated hand expression and gave infant 2 ml of eBM by spoon.) Does the patient have breastfeeding experience prior to this delivery?: Yes  Feeding Feeding Type: Breast Fed Length of feed: 7 min(Mom still BF as LC left room.)  LATCH Score Latch: Grasps breast easily, tongue down, lips flanged, rhythmical sucking.  Audible Swallowing: Spontaneous and intermittent  Type of Nipple: Everted at rest and after stimulation  Comfort (Breast/Nipple): Soft / non-tender  Hold (Positioning): No assistance needed to correctly position infant at breast.  LATCH Score: 10  Interventions Interventions: Breast feeding basics reviewed;Breast massage;Hand express;Breast compression  Lactation Tools Discussed/Used WIC Program: Yes   Consult Status Consult Status: Follow-up Date:  11/13/17 Follow-up type: In-patient    Danelle EarthlyRobin Jacqulin Brandenburger 11/12/2017, 6:45 AM

## 2017-11-13 MED ORDER — MEASLES, MUMPS & RUBELLA VAC ~~LOC~~ INJ
0.5000 mL | INJECTION | Freq: Once | SUBCUTANEOUS | Status: AC
  Administered 2017-11-13: 0.5 mL via SUBCUTANEOUS
  Filled 2017-11-13: qty 0.5

## 2017-11-13 MED ORDER — IBUPROFEN 600 MG PO TABS: 600.0000 mg | ORAL_TABLET | Freq: Four times a day (QID) | ORAL | 1 refills | Status: DC | PRN

## 2017-11-13 NOTE — Discharge Summary (Signed)
OB Discharge Summary     Patient Name: Jessica Massey DOB: August 09, 1990 MRN: [id number]  Date of admission: 11/11/2017 Delivering MD: Huel Cote   Date of discharge: 11/13/2017  Admitting diagnosis: INDUCTION Intrauterine pregnancy: [redacted]w[redacted]d     Secondary diagnosis:  Active Problems:   Gestational diabetes mellitus (GDM) affecting pregnancy   NSVD (normal spontaneous vaginal delivery)  Additional problems: none     Discharge diagnosis: Term Pregnancy Delivered, GDM A2 and morbid obesity                                                                                                Post partum procedures:none  Augmentation: AROM and Pitocin  Complications: None  Hospital course:  Induction of Labor With Vaginal Delivery   27 y.o. yo G3P3003 at [redacted]w[redacted]d was admitted to the hospital 11/11/2017 for induction of labor.  Indication for induction: A2 DM.  Patient had an uncomplicated labor course as follows: Membrane Rupture Time/Date: 9:17 AM ,11/11/2017   Intrapartum Procedures: Episiotomy: None [1]                                         Lacerations:  None [1]  Patient had delivery of a Viable infant.  Information for the patient's newborn:  Karysa, Heft Girl Rafael [[id number]]  Delivery Method: Vaginal, Spontaneous(Filed from Delivery Summary)   11/11/2017  Details of delivery can be found in separate delivery note.  Patient had a routine postpartum course. Patient is discharged home 11/13/17.  Physical exam  Vitals:   11/12/17 0551 11/12/17 1507 11/12/17 2318 11/13/17 0514  BP: 138/82 122/85 138/83 129/81  Pulse: 70 66 75 78  Resp: 18 16 18 18   Temp: 98.2 F (36.8 C) (!) 97.5 F (36.4 C) 97.6 F (36.4 C) 97.7 F (36.5 C)  TempSrc: Axillary Oral  Oral  SpO2:  100%    Weight:      Height:       General: alert, cooperative and no distress Lochia: appropriate Uterine Fundus: firm Incision: N/A DVT Evaluation: No evidence of DVT seen on physical exam. Calf/Ankle edema  is present Labs: Lab Results  Component Value Date   WBC 15.3 (H) 11/12/2017   HGB 10.4 (L) 11/12/2017   HCT 32.4 (L) 11/12/2017   MCV 78.6 11/12/2017   PLT 289 11/12/2017   CMP Latest Ref Rng & Units 03/04/2016  Glucose 65 - 99 mg/dL 81  BUN 6 - 20 mg/dL 11  Creatinine 7.82 - 9.56 mg/dL 2.13  Sodium 086 - 578 mmol/L 140  Potassium 3.5 - 5.2 mmol/L 4.0  Chloride 96 - 106 mmol/L 98  CO2 18 - 29 mmol/L 24  Calcium 8.7 - 10.2 mg/dL 9.5  Total Protein 6.0 - 8.5 g/dL 7.4  Total Bilirubin 0.0 - 1.2 mg/dL 0.3  Alkaline Phos 39 - 117 IU/L 62  AST 0 - 40 IU/L 24  ALT 0 - 32 IU/L 23    Discharge instruction: per After Visit Summary and "Baby and Me Booklet".  After visit meds:  Allergies as of 11/13/2017      Reactions   Latex Anaphylaxis, Hives      Medication List    TAKE these medications   ibuprofen 600 MG tablet Commonly known as:  ADVIL,MOTRIN Take 1 tablet (600 mg total) by mouth every 6 (six) hours as needed for cramping.   metFORMIN 500 MG tablet Commonly known as:  GLUCOPHAGE Take 500 mg by mouth 2 (two) times daily.   terconazole 0.4 % vaginal cream Commonly known as:  TERAZOL 7 Place 1 applicator vaginally at bedtime for 7 days.       Diet: routine diet  Activity: Advance as tolerated. Pelvic rest for 6 weeks.   Outpatient follow up:6 weeks Follow up Appt:No future appointments. Follow up Visit:No follow-ups on file.  Postpartum contraception: Not Discussed  Newborn Data: Live born female  Birth Weight: 7 lb 12 oz (3515 g) APGAR: 8, 9  Newborn Delivery   Birth date/time:  11/11/2017 11:39:00 Delivery type:  Vaginal, Spontaneous     Baby Feeding: Breast Disposition:home with mother   11/13/2017 Cathrine Musterecilia W Leray Garverick, DO

## 2017-11-13 NOTE — Lactation Note (Signed)
This note was copied from a baby's chart. Lactation Consultation Note Baby 41 hrs. Old. Mom plans on d/c home today. Mom's 3rd child. 9 and 27 yrs old. Mom BF/Formula fed both children for 9 months the longest. Mom actively attempting to BF baby as LC entered rm in football position.  Mom had breast laying on top of baby. Baby in supine position w/head turning towards mom. Discussed body alignment during feeding. Discussed safety w/positioning of breast, and tilting baby's head backwards slightly w/chin inwards to breast to allow safe breathing in large breast. Encouraged mom not to lay breast on top of baby d/t will be to heavy when milk comes in.  Mom has large breast w/large everted nipples. Asked mom if she thought the baby was getting a deep latch, mom stated no. Discussed cheeks to breast chin tug.  Hand expressed colostrum squirting. Mom had nipples pierced, colostrum pouring freely. Encouraged mom to pump. Mom has personal DEBP. Discussed engorgement, management, pumping, fast let down, wearing supportive bra, milk storage, and encouraged no to supplement if possible d/t large volume of colostrum at this time.  Discussed importance of cont. STS and I&O.   Mom has WIC, has f/u appt. Reminded of outside recourses and LC services. Mom states has good understanding.  Patient Name: Jessica Juan QuamBrooke Massey UJWJX'BToday's Date: 11/13/2017 Reason for consult: Follow-up assessment   Maternal Data    Feeding Feeding Type: Breast Fed Length of feed: 10 min  LATCH Score Latch: Repeated attempts needed to sustain latch, nipple held in mouth throughout feeding, stimulation needed to elicit sucking reflex.  Audible Swallowing: Spontaneous and intermittent  Type of Nipple: Everted at rest and after stimulation  Comfort (Breast/Nipple): Soft / non-tender  Hold (Positioning): No assistance needed to correctly position infant at breast.  LATCH Score: 9  Interventions Interventions: Breast feeding  basics reviewed;Support pillows;Assisted with latch;Position options;Breast massage;Hand express;Breast compression(personal DEBP)  Lactation Tools Discussed/Used     Consult Status Consult Status: Complete Date: 11/13/17    Jessica DancerCARVER, Jessica Massey 11/13/2017, 5:21 AM

## 2017-11-13 NOTE — Progress Notes (Signed)
Patient ID: Jessica Massey, female   DOB: 02/17/1991, 27 y.o.   MRN: [id number] Pt doing well with no complaints. Lochia mild, pain well controlled, ambulating and tolerating diet well. She denies any headaches or blurry vision. Bonding well with baby - breastfeeding. Ready for discharge to home VSS ABD - FF, soft EXT - mild edema but no homans  A/P: PPD#2 s/p svd - stable         No further glucose screening per nl on day 1         Discharge instructions reviewed

## 2017-11-13 NOTE — Discharge Instructions (Signed)
Nothing in vagina for 6 weeks.  No sex, tampons, and douching.  Other instructions as in [facility] Discharge Booklet. °

## 2018-04-20 ENCOUNTER — Other Ambulatory Visit: Payer: Self-pay

## 2018-04-20 ENCOUNTER — Encounter (HOSPITAL_COMMUNITY): Payer: Self-pay | Admitting: *Deleted

## 2018-04-20 ENCOUNTER — Emergency Department (HOSPITAL_COMMUNITY)
Admission: EM | Admit: 2018-04-20 | Discharge: 2018-04-20 | Disposition: A | Discharge status: Home / Self Care | Payer: Medicaid Other | Attending: Emergency Medicine | Admitting: Emergency Medicine

## 2018-04-20 DIAGNOSIS — R197 Diarrhea, unspecified: Secondary | ICD-10-CM | POA: Insufficient documentation

## 2018-04-20 DIAGNOSIS — Z87891 Personal history of nicotine dependence: Secondary | ICD-10-CM | POA: Insufficient documentation

## 2018-04-20 DIAGNOSIS — R112 Nausea with vomiting, unspecified: Secondary | ICD-10-CM | POA: Insufficient documentation

## 2018-04-20 LAB — COMPREHENSIVE METABOLIC PANEL
ALT: 20 U/L (ref 0–44)
ANION GAP: 8 (ref 5–15)
AST: 19 U/L (ref 15–41)
Albumin: 4.2 g/dL (ref 3.5–5.0)
Alkaline Phosphatase: 65 U/L (ref 38–126)
BUN: 16 mg/dL (ref 6–20)
CHLORIDE: 105 mmol/L (ref 98–111)
CO2: 24 mmol/L (ref 22–32)
Calcium: 8.6 mg/dL — ABNORMAL LOW (ref 8.9–10.3)
Creatinine, Ser: 0.7 mg/dL (ref 0.44–1.00)
GFR calc Af Amer: >60 mL/min (ref >60)
GFR calc non Af Amer: >60 mL/min (ref >60)
Glucose, Bld: 118 mg/dL — ABNORMAL HIGH (ref 70–99)
POTASSIUM: 3.5 mmol/L (ref 3.5–5.1)
Sodium: 137 mmol/L (ref 135–145)
Total Bilirubin: 0.9 mg/dL (ref 0.3–1.2)
Total Protein: 7.7 g/dL (ref 6.5–8.1)

## 2018-04-20 LAB — URINALYSIS, ROUTINE W REFLEX MICROSCOPIC
Bacteria, UA: NONE SEEN
Bilirubin Urine: NEGATIVE
Glucose, UA: NEGATIVE mg/dL
Ketones, ur: NEGATIVE mg/dL
Leukocytes,Ua: NEGATIVE
Nitrite: NEGATIVE
Protein, ur: NEGATIVE mg/dL
Specific Gravity, Urine: 1.028 (ref 1.005–1.030)
pH: 5 (ref 5.0–8.0)

## 2018-04-20 LAB — CBC
HEMATOCRIT: 41.4 % (ref 36.0–46.0)
Hemoglobin: 12.7 g/dL (ref 12.0–15.0)
MCH: 25.4 pg — ABNORMAL LOW (ref 26.0–34.0)
MCHC: 30.7 g/dL (ref 30.0–36.0)
MCV: 82.8 fL (ref 80.0–100.0)
Platelets: 276 10*3/uL (ref 150–400)
RBC: 5 MIL/uL (ref 3.87–5.11)
RDW: 14.6 % (ref 11.5–15.5)
WBC: 9.2 10*3/uL (ref 4.0–10.5)
nRBC: 0 % (ref 0.0–0.2)

## 2018-04-20 LAB — I-STAT BETA HCG BLOOD, ED (MC, WL, AP ONLY): I-stat hCG, quantitative: <5 m[IU]/mL (ref <5)

## 2018-04-20 LAB — LIPASE, BLOOD: Lipase: 29 U/L (ref 11–51)

## 2018-04-20 MED ORDER — ONDANSETRON 4 MG PO TBDP: 4.0000 mg | ORAL_TABLET | Freq: Three times a day (TID) | ORAL | 0 refills | Status: DC | PRN

## 2018-04-20 MED ORDER — SODIUM CHLORIDE 0.9% FLUSH: 3.0000 mL | Freq: Once | INTRAVENOUS | Status: DC

## 2018-04-20 MED ORDER — ONDANSETRON 8 MG PO TBDP
8.0000 mg | ORAL_TABLET | Freq: Once | ORAL | Status: AC
  Administered 2018-04-20: 8 mg via ORAL
  Filled 2018-04-20: qty 1

## 2018-04-20 NOTE — ED Triage Notes (Signed)
Pt states she has had vomiting and diarrhea since yesterday. Unable to keep any po's in.

## 2018-04-20 NOTE — ED Provider Notes (Signed)
[facility]-EMERGENCY DEPT Provider Note   CSN: [id number] Arrival date & time: 04/20/18  2023    History   Chief Complaint Chief Complaint  Patient presents with  . Emesis  . Diarrhea    HPI Jessica Massey is a 28 y.o. female.     Patient presents to the ED with a chief complaint of nausea, vomiting, and diarrhea.  Onset of symptoms was yesterday.  She denies any fever.  She has not taken anything for the symptoms.  Her daughter has been sick with the same.  She reports some associated crampy left upper abdominal pain.  Denies any other associated symptoms.  The history is provided by the patient. No language interpreter was used.    Past Medical History:  Diagnosis Date  . Constipation   . Dyspnea    with exertion  . Gestational diabetes   . Headache    migraine-   . Optic disc edema   . Pseudotumor cerebri 01/16/2016    Patient Active Problem List   Diagnosis Date Noted  . Gestational diabetes mellitus (GDM) affecting pregnancy 11/11/2017  . NSVD (normal spontaneous vaginal delivery) 11/11/2017  . Abnormal glucose tolerance test (GTT) during pregnancy, antepartum 09/15/2017  . Pseudotumor cerebri 01/16/2016  . Transient alteration of awareness 01/16/2016  . Syncope and collapse 01/16/2016  . Migraine 12/07/2013  . Confusion 11/25/2013  . Obesity 11/25/2013  . Facial spasm 11/25/2013  . Drowsiness 11/25/2013    Past Surgical History:  Procedure Laterality Date  . APPENDECTOMY    . TOOTH EXTRACTION N/A 02/28/2017   Procedure: DENTAL EXTRACTIONS IMPACTED TEETH NUMBER SIXTEEN, SEVENTEEN, THIRTY-TWO;  Surgeon: Ocie Doyne, DDS;  Location: [facility] OR;  Service: Oral Surgery;  Laterality: N/A;     OB History    Gravida  3   Para  3   Term  3   Preterm  0   AB  0   Living  3     SAB  0   TAB  0   Ectopic  0   Multiple  0   Live Births  3            Home Medications    Prior to Admission medications   Medication  Sig Start Date End Date Taking? Authorizing Provider  ibuprofen (ADVIL,MOTRIN) 600 MG tablet Take 1 tablet (600 mg total) by mouth every 6 (six) hours as needed for cramping. Patient not taking: Reported on 04/20/2018 11/13/17   Edwinna Areola, DO    Family History Family History  Problem Relation Age of Onset  . Hypertension Mother   . Diabetes Mother   . Aneurysm Father   . High blood pressure Other   . Breast cancer Maternal Grandmother   . Asthma Brother   . Spina bifida Brother     Social History Social History   Tobacco Use  . Smoking status: Former Smoker    Packs/day: 0.25    Types: Cigarettes    Last attempt to quit: 12/2016    Years since quitting: 1.3  . Smokeless tobacco: Never Used  . Tobacco comment: 10  Substance Use Topics  . Alcohol use: Not Currently    Alcohol/week: 1.0 standard drinks    Types: 1 Shots of liquor per week    Comment: occasional social- 2 drinks  2 times a month- 02/27/17  . Drug use: No     Allergies   Latex   Review of Systems Review of Systems  All  other systems reviewed and are negative.    Physical Exam Updated Vital Signs BP (!) 126/95 (BP Location: Right Arm)   Pulse (!) 118   Temp 98.8 F (37.1 C) (Oral)   Resp 16   Ht 5\' 2"  (1.575 m)   Wt (!) 138.3 kg   SpO2 97%   BMI 55.79 kg/m   Physical Exam Vitals signs and nursing note reviewed.  Constitutional:      Appearance: She is well-developed.  HENT:     Head: Normocephalic and atraumatic.  Eyes:     Conjunctiva/sclera: Conjunctivae normal.     Pupils: Pupils are equal, round, and reactive to light.  Neck:     Musculoskeletal: Normal range of motion and neck supple.  Cardiovascular:     Rate and Rhythm: Normal rate and regular rhythm.     Heart sounds: No murmur. No friction rub. No gallop.   Pulmonary:     Effort: Pulmonary effort is normal. No respiratory distress.     Breath sounds: Normal breath sounds. No wheezing or rales.  Chest:     Chest  wall: No tenderness.  Abdominal:     General: Bowel sounds are normal. There is no distension.     Palpations: Abdomen is soft. There is no mass.     Tenderness: There is no abdominal tenderness. There is no guarding or rebound.     Comments: No focal abdominal tenderness, no RLQ tenderness or pain at McBurney's point, no RUQ tenderness or Murphy's sign, no left-sided abdominal tenderness, no fluid wave, or signs of peritonitis   Musculoskeletal: Normal range of motion.        General: No tenderness.  Skin:    General: Skin is warm and dry.  Neurological:     Mental Status: She is alert and oriented to person, place, and time.  Psychiatric:        Behavior: Behavior normal.        Thought Content: Thought content normal.        Judgment: Judgment normal.      ED Treatments / Results  Labs (all labs ordered are listed, but only abnormal results are displayed) Labs Reviewed  COMPREHENSIVE METABOLIC PANEL - Abnormal; Notable for the following components:      Result Value   Glucose, Bld 118 (*)    Calcium 8.6 (*)    All other components within normal limits  CBC - Abnormal; Notable for the following components:   MCH 25.4 (*)    All other components within normal limits  URINALYSIS, ROUTINE W REFLEX MICROSCOPIC - Abnormal; Notable for the following components:   Hgb urine dipstick LARGE (*)    All other components within normal limits  LIPASE, BLOOD  I-STAT BETA HCG BLOOD, ED ([facility], [facility], [facility] ONLY)    EKG None  Radiology No results found.  Procedures Procedures (including critical care time)  Medications Ordered in ED Medications  sodium chloride flush (NS) 0.9 % injection 3 mL (has no administration in time range)  ondansetron (ZOFRAN-ODT) disintegrating tablet 8 mg (has no administration in time range)     Initial Impression / Assessment and Plan / ED Course  I have reviewed the triage vital signs and the nursing notes.  Pertinent labs & imaging results that were  available during my care of the patient were reviewed by me and considered in my medical decision making (see chart for details).        Patient with nausea, vomiting, and diarrhea.  Onset yesterday.  Daughter sick with the same.  No focal tenderness on exam.  VSS.  Labs are reassuring.  DC home with zofran.  Final Clinical Impressions(s) / ED Diagnoses   Final diagnoses:  Nausea vomiting and diarrhea    ED Discharge Orders         Ordered    ondansetron (ZOFRAN ODT) 4 MG disintegrating tablet  Every 8 hours PRN     04/20/18 2304           Roxy Horseman, PA-C 04/20/18 2306    Mancel Bale, MD 04/21/18 0025

## 2018-12-21 LAB — OB RESULTS CONSOLE ABO/RH: RH Type: POSITIVE

## 2018-12-21 LAB — OB RESULTS CONSOLE HIV ANTIBODY (ROUTINE TESTING): HIV: NONREACTIVE

## 2018-12-21 LAB — OB RESULTS CONSOLE RUBELLA ANTIBODY, IGM: Rubella: IMMUNE

## 2018-12-21 LAB — OB RESULTS CONSOLE RPR: RPR: NONREACTIVE

## 2018-12-21 LAB — OB RESULTS CONSOLE GC/CHLAMYDIA
Chlamydia: NEGATIVE
Gonorrhea: NEGATIVE

## 2018-12-21 LAB — OB RESULTS CONSOLE HEPATITIS B SURFACE ANTIGEN: Hepatitis B Surface Ag: NEGATIVE

## 2018-12-21 LAB — OB RESULTS CONSOLE GBS: GBS: POSITIVE

## 2018-12-21 LAB — OB RESULTS CONSOLE ANTIBODY SCREEN: Antibody Screen: NEGATIVE

## 2019-01-15 IMAGING — RF DG UGI W/ HIGH DENSITY W/KUB
6 series · 15 of 22 positions shown · non-contrast
Comparison: None.

CLINICAL DATA: Preoperative clearance

EXAM:
UPPER GI SERIES WITH KUB
TECHNIQUE: After obtaining a scout radiograph a routine upper GI series was
performed using thin and high density barium.
FLUOROSCOPY TIME:  Fluoroscopy Time:  1 minutes 36 second
Radiation Exposure Index (if provided by the fluoroscopic device):
Number of Acquired Spot Images: 0

[Series 1: one shot · 1 of 1 slices shown (1 of 3)]
[im 1/1]
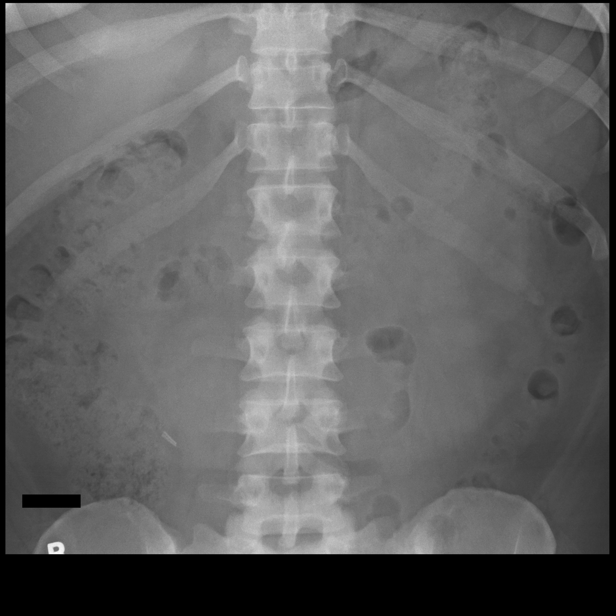

[Series 2: sequence · 2 of 30 frames shown (1 of 3)]
[frame 16/30]
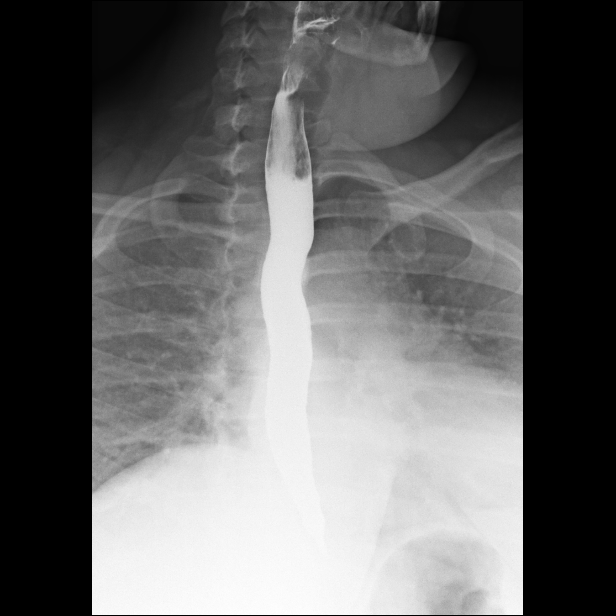
[frame 26/30]
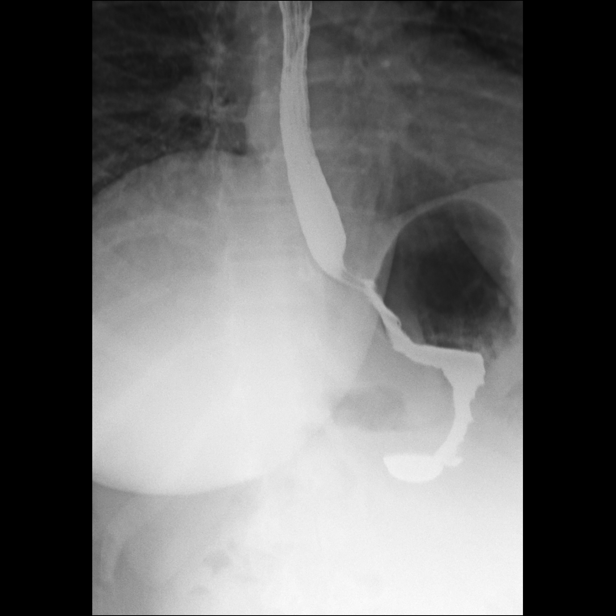

[Series 3: sequence · 2 of 17 frames shown (2 of 3)]
[frame 3/17]
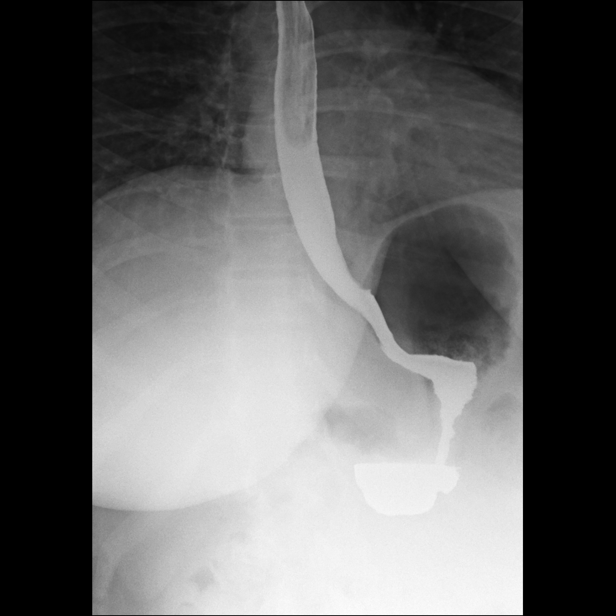
[frame 9/17]
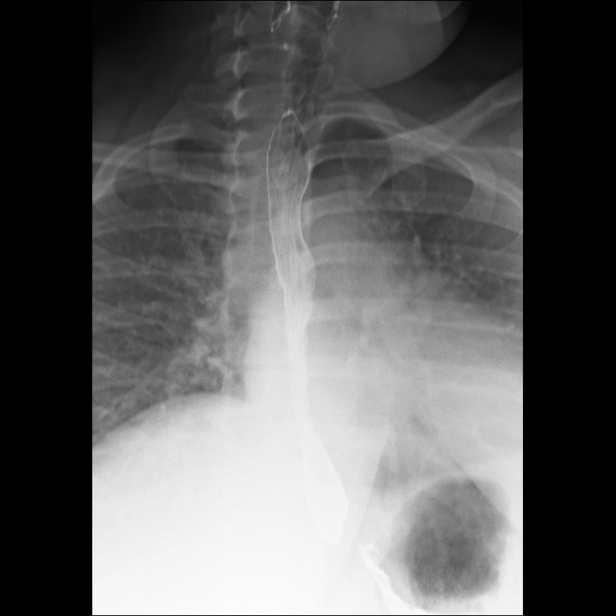

[Series 4: one shot · 4 of 5 slices shown (2 of 3)]
[im 1/5]
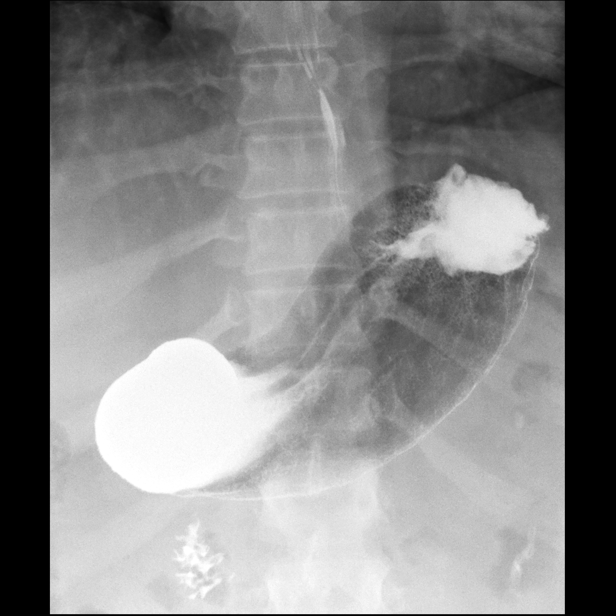
[im 2/5]
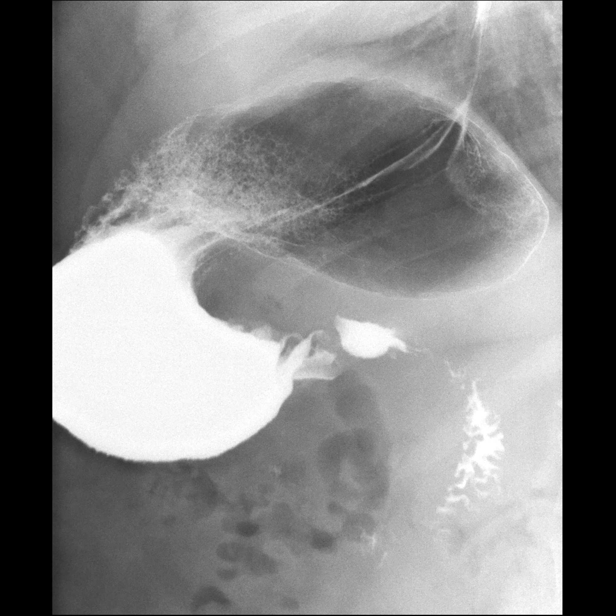
[im 4/5]
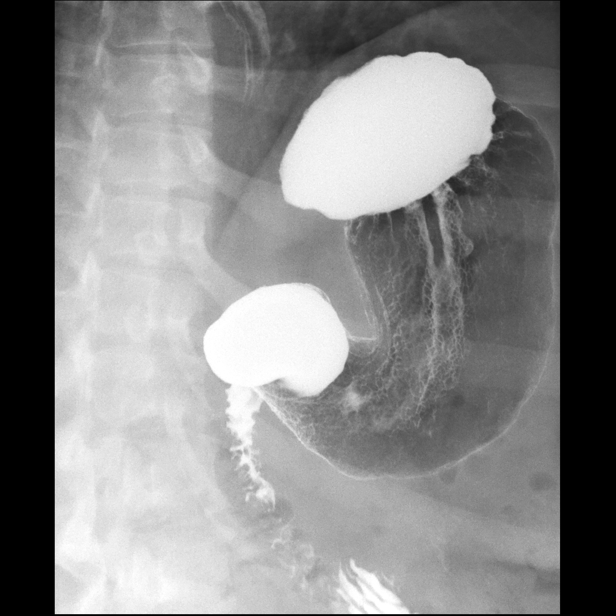
[im 5/5]
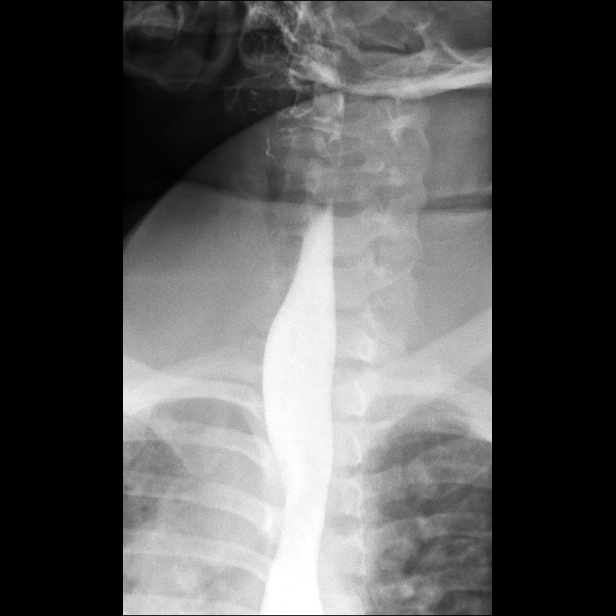

[Series 5: sequence · 3 of 20 frames shown (3 of 3)]
[frame 4/20]
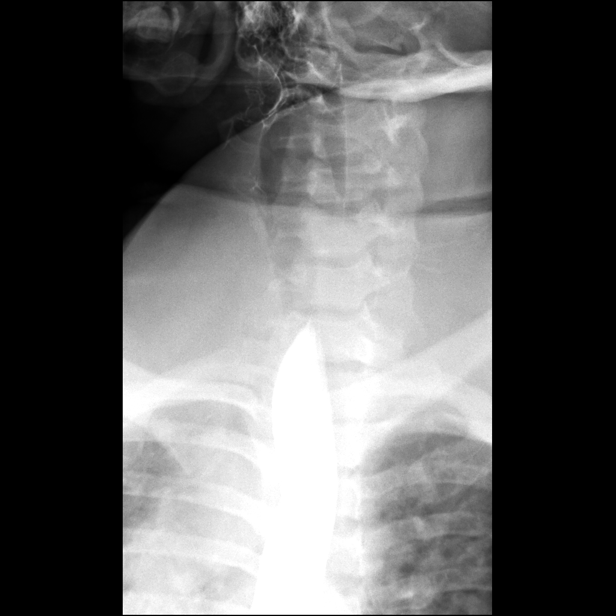
[frame 11/20]
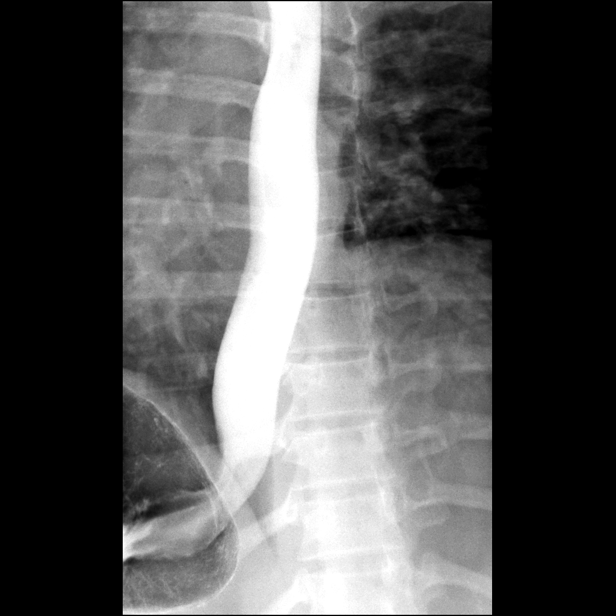
[frame 18/20]
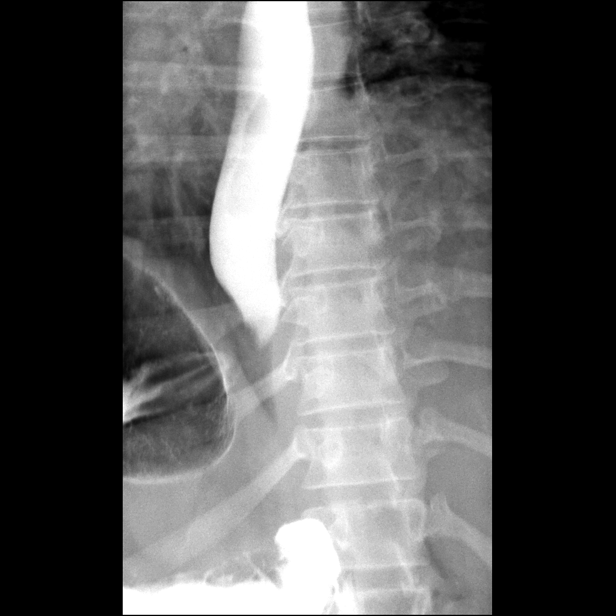

[Series 6: one shot · 3 of 5 slices shown (3 of 3)]
[im 2/5]
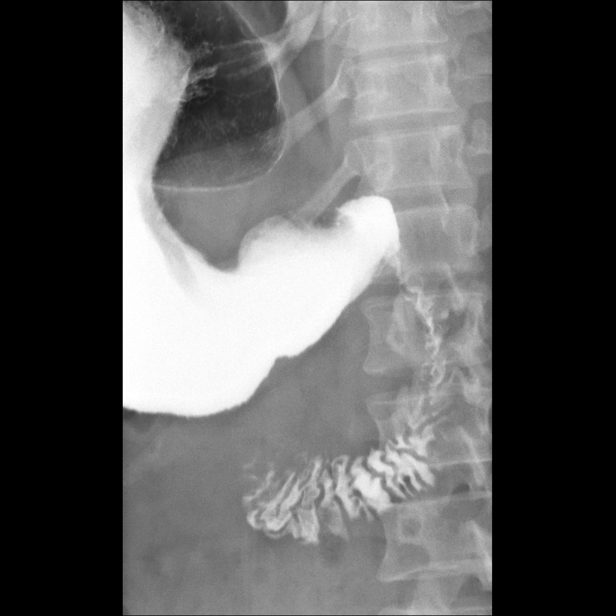
[im 3/5]
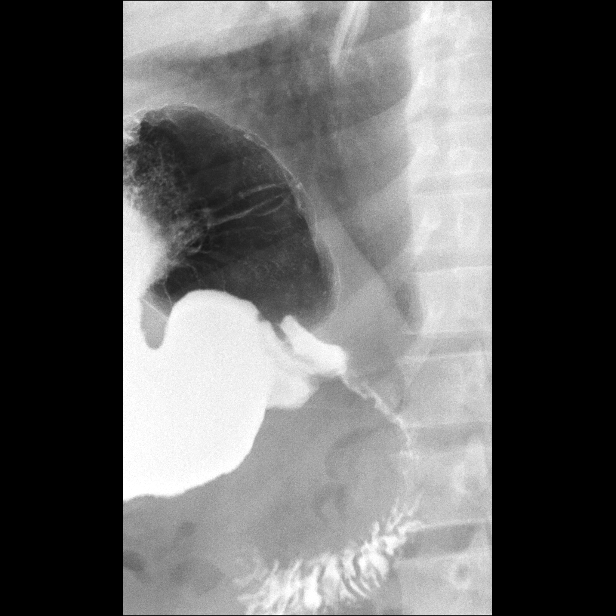
[im 5/5]
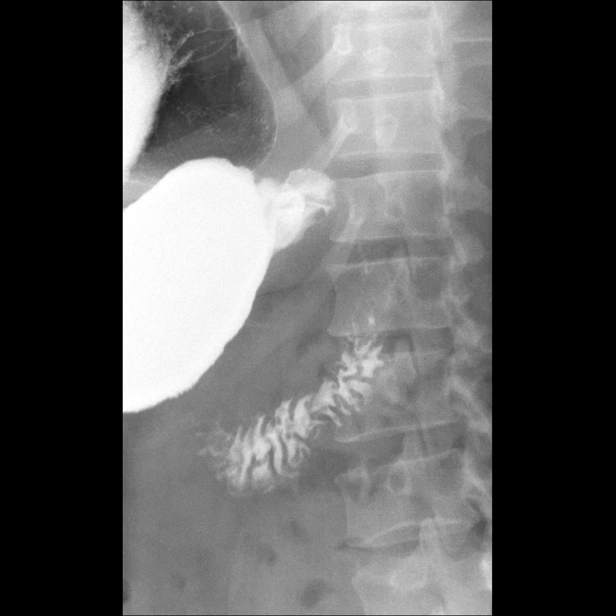

[15 of 22 positions shown; findings below may reference images not displayed]

FINDINGS: Preliminary KUB negative. Normal bowel gas pattern. Surgical clip in
the right mid abdomen. Moderate stool in the right colon

Esophageal mucosa and motility normal. Negative for ulcer or mass or
stricture. No hiatal hernia or reflux identified

Gastric mucosa is smooth and normal.  No ulcer or mass or edema

Duodenal bulb is normal
IMPRESSION: Negative

PLEASE PICK CORRECT UPPER GI TEMPLATE

## 2019-02-26 NOTE — L&D Delivery Note (Signed)
Delivery Note Pt precipitously progressed to complete and began involuntarily pushing.  I was in next room with a prolonged decel and as soon as the other's baby heart rate back up I entered room within 5 minutes and baby was just delivering with OB fellow at bedside.  I assumed care and stimulated baby and cut cord after delayed clamping.    At 10:08 AM a healthy female was delivered via Vaginal, Spontaneous (Presentation:   OA   ).  APGAR: 8, 9; weight  pending.   Placenta status:  Delivered spontaneously and appeared intact.  Cord:   with the following complications: nuchal x 1 delivered through .    Anesthesia:  none Episiotomy:  none Lacerations:  none Suture Repair: n/a Est. Blood Loss (mL):   Mom to postpartum.  Baby to Couplet care / Skin to Skin.  D/w pt circumcision and they desire in the hospital  Oliver Pila 07/29/2019, 10:20 AM

## 2019-04-27 ENCOUNTER — Encounter (HOSPITAL_COMMUNITY): Payer: Self-pay | Admitting: Obstetrics and Gynecology

## 2019-04-27 ENCOUNTER — Inpatient Hospital Stay (HOSPITAL_COMMUNITY)
Admission: AD | Admit: 2019-04-27 | Discharge: 2019-04-27 | Discharge status: Against Medical Advice | Payer: Medicaid Other | Attending: Obstetrics and Gynecology | Admitting: Obstetrics and Gynecology

## 2019-04-27 ENCOUNTER — Other Ambulatory Visit: Payer: Self-pay

## 2019-04-27 DIAGNOSIS — Z5329 Procedure and treatment not carried out because of patient's decision for other reasons: Secondary | ICD-10-CM | POA: Insufficient documentation

## 2019-04-27 LAB — URINALYSIS, ROUTINE W REFLEX MICROSCOPIC
Bilirubin Urine: NEGATIVE
Glucose, UA: NEGATIVE mg/dL
Hgb urine dipstick: NEGATIVE
Ketones, ur: NEGATIVE mg/dL
Leukocytes,Ua: NEGATIVE
Nitrite: NEGATIVE
Protein, ur: NEGATIVE mg/dL
Specific Gravity, Urine: 1.017 (ref 1.005–1.030)
pH: 7 (ref 5.0–8.0)

## 2019-04-27 NOTE — MAU Note (Signed)
PT SAYS FEELS UC SINCE 7 PM - 4-5 MIN APART. PNC-  DID NOT CALL TONIGHT .    SAW DR Ellyn Hack YESTERDAY- FOR REG VISIT. LAST SEX- LAST NIGHT .

## 2019-04-27 NOTE — MAU Note (Signed)
PT SAYS SHE DID NOT WANT TO STAY TO BE SEEN- SHE DIDN'T WANT  TO " STRIP DOWN ".   I EXPLAINED THAT WE NEED TO PUT HER ON THE MONITOR AND CHECK CERVIX- . SHE SAYS SHE WOULD RETURN IF NEEDED.

## 2019-06-07 ENCOUNTER — Other Ambulatory Visit: Payer: Self-pay

## 2019-06-07 ENCOUNTER — Encounter (HOSPITAL_COMMUNITY): Payer: Self-pay | Admitting: Obstetrics and Gynecology

## 2019-06-07 ENCOUNTER — Inpatient Hospital Stay (HOSPITAL_COMMUNITY)
Admission: AD | Admit: 2019-06-07 | Discharge: 2019-06-07 | Disposition: A | Discharge status: Home / Self Care | Payer: Medicaid Other | Attending: Obstetrics and Gynecology | Admitting: Obstetrics and Gynecology

## 2019-06-07 DIAGNOSIS — Z3A32 32 weeks gestation of pregnancy: Secondary | ICD-10-CM

## 2019-06-07 DIAGNOSIS — Z0371 Encounter for suspected problem with amniotic cavity and membrane ruled out: Secondary | ICD-10-CM | POA: Diagnosis not present

## 2019-06-07 DIAGNOSIS — Z87891 Personal history of nicotine dependence: Secondary | ICD-10-CM | POA: Diagnosis not present

## 2019-06-07 DIAGNOSIS — O26893 Other specified pregnancy related conditions, third trimester: Secondary | ICD-10-CM | POA: Insufficient documentation

## 2019-06-07 LAB — WET PREP, GENITAL
Clue Cells Wet Prep HPF POC: NONE SEEN
Sperm: NONE SEEN
Trich, Wet Prep: NONE SEEN
Yeast Wet Prep HPF POC: NONE SEEN

## 2019-06-07 LAB — AMNISURE RUPTURE OF MEMBRANE (ROM) NOT AT ARMC: Amnisure ROM: NEGATIVE

## 2019-06-07 NOTE — Discharge Instructions (Signed)
Premature Rupture and Preterm Premature Rupture of Membranes  A sac made up of membranes surrounds your baby in the womb (uterus). Rupture of membranes is when this sac breaks open. This is also known as your "water breaking." When this sac breaks before labor starts, it is called premature rupture of membranes (PROM). If this happens before 37 weeks of being pregnant, it is called preterm premature rupture of membranes (PPROM). PPROM is serious. It needs medical care right away. What increases the risk of PPROM? PPROM is more likely to happen in women who:  Have an infection.  Have had PPROM before.  Have a cervix that is short.  Have bleeding during the second or third trimester.  Have a low BMI. This is a measure of body fat.  Smoke.  Use drugs.  Have a low socioeconomic status. What problems can be caused by PROM and PPROM? This condition creates health dangers for the mother and the baby. These include:  Giving birth to the baby too early (prematurely).  Getting a serious infection of the placenta (chorioamnionitis).  Having the placenta detach from the uterus early (placental abruption).  Squeezing of the umbilical cord.  Getting a serious infection after delivery. What are the signs of PROM and PPROM?  A sudden gush of fluid from the vagina.  A slow leak of fluid from the vagina.  Your underwear is wet. What should I do if I think my water broke? Call your doctor right away. You will need to go to the hospital to get checked right away. What happens if I am told that I have PROM or PPROM? You will have tests done at the hospital.  If you have PROM, you may be given medicine to start labor (be induced). This may be done if you are not having contractions during the 24 hours after your water broke.  If you have PPROM and are not having contractions, you may be given medicine to start labor. It will depend on how far along you are in your pregnancy. If you have  PPROM:  You and your baby will be watched closely to see if you have infections or other problems.  You may be given: ? An antibiotic medicine. This can stop an infection from starting. ? A steroid medicine. This can help your baby's lungs develop faster. ? A medicine to help prevent cerebral palsy in your baby. ? A medicine to stop early labor (preterm labor).  You may be told to stay in bed except to use the bathroom (bed rest).  You may be given medicine to start labor. This may be done if there are problems with you or the baby. Your treatment will depend on many factors. Contact a doctor if:  Your water breaks and you are not having contractions. Get help right away if:  Your water breaks before you are [redacted] weeks pregnant. Summary  When your water breaks before labor starts, it is called premature rupture of membranes (PROM).  When PROM happens before 37 weeks of pregnancy, it is called preterm premature rupture of membranes (PPROM).  If you are not having contractions, your labor may be started for you. This information is not intended to replace advice given to you by your health care provider. Make sure you discuss any questions you have with your health care provider. Document Revised: 06/05/2018 Document Reviewed: 11/02/2015 Elsevier Patient Education  2020 Elsevier Inc[employer]  

## 2019-06-07 NOTE — MAU Note (Signed)
About 1430, had a gush of fluid come out.  Since then has had 3 or 4 more gushes.  Very light contractions.

## 2019-06-07 NOTE — H&P (Signed)
Jessica Massey is a 29 y.o. female [id number] at 29 2/7 weeks (EDD 07/31/19 by 8 week [place]) presenting for several possible gushes of fluid at home. Prenatal care complicated by:  1)Maternal obesity   BMI 55    Baseline HgbA1C 5.4  2)Gestational diabetes--pt failed early glucola (153). started FS and diet    Metformin 1500mg  QHS/500mg  AM    NST's begun  at 32 weeks    Growth at 30 weeks 47%ile   3) Group B Streptococcus carrier--in urine first trimester    4) History of migraine      h/o complex migraines and probable pseudotumor cerebri      No recent issues, did not tolerate topamax in the past  OB History    Gravida  4   Para  3   Term  3   Preterm  0   AB  0   Living  3     SAB  0   TAB  0   Ectopic  0   Multiple  0   Live Births  3         09-10-2008, 38.5 wks  M, 7lbs 12oz, Vaginal Delivery 12-26-2009, 38 wks  F, 8lbs 10oz, Vaginal Delivery 11-11-2017, 39.3 wks F, 7lbs 12oz, Vaginal Delivery  Past Medical History:  Diagnosis Date  . Constipation   . Dyspnea    with exertion  . Gestational diabetes   . Headache    migraine-   . Optic disc edema   . Pseudotumor cerebri 01/16/2016   Past Surgical History:  Procedure Laterality Date  . APPENDECTOMY    . TOOTH EXTRACTION N/A 02/28/2017   Procedure: DENTAL EXTRACTIONS IMPACTED TEETH NUMBER SIXTEEN, SEVENTEEN, THIRTY-TWO;  Surgeon: 04/28/2017, DDS;  Location: [facility] OR;  Service: Oral Surgery;  Laterality: N/A;   Family History: family history includes Aneurysm in her father; Asthma in her brother; Breast cancer in her maternal grandmother; Diabetes in her mother; High blood pressure in an other family member; Hypertension in her mother; Spina bifida in her brother. Social History:  reports that she quit smoking about 2 years ago. Her smoking use included cigarettes. She smoked 0.25 packs per day. She has never used smokeless tobacco. She reports previous alcohol use of about 1.0 standard drinks of  alcohol per week. She reports that she does not use drugs.     Maternal Diabetes: Yes:  Diabetes Type:  Insulin/Medication controlled Genetic Screening: Normal Maternal Ultrasounds/Referrals: Normal Fetal Ultrasounds or other Referrals:  None Maternal Substance Abuse:  No Significant Maternal Medications:  Meds include: Other: METFORMIN Significant Maternal Lab Results:  Group B Strep positive Other Comments:  None  Review of Systems  Constitutional: Negative for fever.  Cardiovascular: Negative.   Gastrointestinal: Negative for abdominal pain.   Maternal Medical History:  Contractions: Frequency: irregular.   Perceived severity is mild.    Fetal activity: Perceived fetal activity is normal.      Dilation: Fingertip Effacement (%): Thick Station: -3 Exam by:: Dr. [email or web address] Blood pressure 131/69, pulse 97, temperature 98.5 F (36.9 C), temperature source Oral, resp. rate 20, height 5' 2.5" (1.588 m), weight (!) 147.8 kg, SpO2 100 %, unknown if currently breastfeeding. Maternal Exam:  Uterine Assessment: Contraction strength is mild.  Contraction frequency is irregular.   Abdomen: Patient reports no abdominal tenderness.   Physical Exam  Constitutional:  Obese  Cardiovascular: Normal rate.  Respiratory: Effort normal.  GI: Soft.  Genitourinary:    Vagina normal.   Neurological:  She is alert.  Psychiatric: She has a normal mood and affect.    Prenatal labs: ABO, Rh:  A positive Antibody:  neg Rubella:  immune RPR:   NR HBsAg:   Neg HIV:   NR GBS:   Positive Essential panel negative Panorama negative One hour glucola early 153   Assessment/Plan: Pt seen in MAU and amniosure negative FHR category 1  MAU provider to d/c home  Logan Bores 06/07/2019, 6:58 PM

## 2019-06-07 NOTE — MAU Provider Note (Addendum)
History     CSN: [id number]  Arrival date and time: 06/07/19 1708   First Provider Initiated Contact with Patient 06/07/19 1756      Chief Complaint  Patient presents with  . Contractions  . Rupture of Membranes   HPI  Jessica Massey is a 29 yo G4P3003 at 79.2 EGA who is presenting to MAU complaining of breaking her water. She reports around 1430, while she was moving small boxes in a storage unit, she felt the urge to urinate. When she sat down in the car, she noticed that the seat was wet. She went home, used the toilet, and showered. She put a pad on, and that ended up being wet as well. She decided at that time to come to MAU.  She denies vaginal bleeding and is feeling her baby move. She denies contractions, but says that occasionally she will have some mild abdominal cramping.  OB History    Gravida  4   Para  3   Term  3   Preterm  0   AB  0   Living  3     SAB  0   TAB  0   Ectopic  0   Multiple  0   Live Births  3           Past Medical History:  Diagnosis Date  . Constipation   . Dyspnea    with exertion  . Gestational diabetes   . Headache    migraine-   . Optic disc edema   . Pseudotumor cerebri 01/16/2016    Past Surgical History:  Procedure Laterality Date  . APPENDECTOMY    . TOOTH EXTRACTION N/A 02/28/2017   Procedure: DENTAL EXTRACTIONS IMPACTED TEETH NUMBER SIXTEEN, SEVENTEEN, THIRTY-TWO;  Surgeon: Ocie Doyne, DDS;  Location: [facility] OR;  Service: Oral Surgery;  Laterality: N/A;    Family History  Problem Relation Age of Onset  . Hypertension Mother   . Diabetes Mother   . Aneurysm Father   . High blood pressure Other   . Breast cancer Maternal Grandmother   . Asthma Brother   . Spina bifida Brother     Social History   Tobacco Use  . Smoking status: Former Smoker    Packs/day: 0.25    Types: Cigarettes    Quit date: 12/2016    Years since quitting: 2.4  . Smokeless tobacco: Never Used  . Tobacco comment: 10  Substance Use  Topics  . Alcohol use: Not Currently    Alcohol/week: 1.0 standard drinks    Types: 1 Shots of liquor per week    Comment: occasional social- 2 drinks  2 times a month- 02/27/17  . Drug use: No    Allergies:  Allergies  Allergen Reactions  . Latex Anaphylaxis and Hives    Medications Prior to Admission  Medication Sig Dispense Refill Last Dose  . metFORMIN (GLUCOPHAGE) 500 MG tablet Take 500 mg by mouth 2 (two) times daily with a meal. 500 in am 1500 at evening   06/07/2019 at Unknown time  . Prenatal Vit-Fe Fumarate-FA (PRENATAL MULTIVITAMIN) TABS tablet Take 1 tablet by mouth daily at 12 noon.   06/07/2019 at Unknown time  . ibuprofen (ADVIL,MOTRIN) 600 MG tablet Take 1 tablet (600 mg total) by mouth every 6 (six) hours as needed for cramping. (Patient not taking: Reported on 04/20/2018) 40 tablet 1   . ondansetron (ZOFRAN ODT) 4 MG disintegrating tablet Take 1 tablet (4 mg total) by mouth every 8 (  eight) hours as needed for nausea or vomiting. 10 tablet 0     Review of Systems  All other systems reviewed and are negative.  Physical Exam   Blood pressure 131/69, pulse 97, temperature 98.5 F (36.9 C), temperature source Oral, resp. rate 20, height 5' 2.5" (1.588 m), weight (!) 147.8 kg, SpO2 100 %, unknown if currently breastfeeding.  Physical Exam  Nursing note and vitals reviewed. Constitutional: She is oriented to person, place, and time. She appears well-developed and well-nourished.  HENT:  Head: Normocephalic and atraumatic.  Eyes: Pupils are equal, round, and reactive to light. Conjunctivae and EOM are normal.  Cardiovascular: Normal rate and regular rhythm.  Respiratory: Effort normal and breath sounds normal.  GI: Soft. Bowel sounds are normal. She exhibits no distension and no mass. There is no abdominal tenderness. There is no rebound and no guarding.  gravid  Genitourinary:    Uterus normal.     Vaginal discharge (thin) present.     Genitourinary Comments: No  pooling, negative cough test   Musculoskeletal:     Cervical back: Normal range of motion and neck supple.  Neurological: She is alert and oriented to person, place, and time. She has normal reflexes.  Skin: Skin is warm.  Psychiatric: She has a normal mood and affect. Her behavior is normal. Judgment and thought content normal.    MAU Course  Procedures  MDM - Fern negative, will do amnisure - SVE FT/thick/-3 - No contractions on TOCO  NST -baseline: 145 -variability: moderate -accels: 15x15 -decels: none -interpretation: reactive  Assessment and Plan  29 yo G4P3003 at 32.2 EGA who is presenting to MAU complaining of a gush of fluid -Fern and Amnisure negative, likely involuntary urination -DC to home with strict return precautions -Follow up in OB office on Thursday as scheduled  Delmar Dondero L Alla Sloma 06/07/2019, 7:20 PM

## 2019-06-09 LAB — CULTURE, BETA STREP (GROUP B ONLY)

## 2019-06-11 ENCOUNTER — Other Ambulatory Visit: Payer: Self-pay | Admitting: Student

## 2019-07-15 ENCOUNTER — Telehealth (HOSPITAL_COMMUNITY): Payer: Self-pay | Admitting: *Deleted

## 2019-07-15 ENCOUNTER — Encounter (HOSPITAL_COMMUNITY): Payer: Self-pay | Admitting: *Deleted

## 2019-07-15 NOTE — Telephone Encounter (Signed)
Preadmission screen  

## 2019-07-27 ENCOUNTER — Other Ambulatory Visit (HOSPITAL_COMMUNITY)
Admission: RE | Admit: 2019-07-27 | Discharge: 2019-07-27 | Disposition: A | Discharge status: Home / Self Care | Payer: Medicaid Other | Source: Ambulatory Visit | Attending: Obstetrics and Gynecology | Admitting: Obstetrics and Gynecology

## 2019-07-27 ENCOUNTER — Other Ambulatory Visit: Payer: Self-pay | Admitting: Obstetrics and Gynecology

## 2019-07-27 DIAGNOSIS — Z20822 Contact with and (suspected) exposure to covid-19: Secondary | ICD-10-CM | POA: Diagnosis not present

## 2019-07-27 DIAGNOSIS — Z01812 Encounter for preprocedural laboratory examination: Secondary | ICD-10-CM | POA: Insufficient documentation

## 2019-07-27 LAB — SARS CORONAVIRUS 2 (TAT 6-24 HRS): SARS Coronavirus 2: NEGATIVE

## 2019-07-28 ENCOUNTER — Other Ambulatory Visit: Payer: Self-pay | Admitting: Obstetrics and Gynecology

## 2019-07-28 NOTE — H&P (Signed)
Jessica Massey is a 29 y.o. female [id number] at 82 5/7 weeks (EDD 07/31/19 by 8 week [place] inconsistent with LMP) presenting for IOL at term.  Prenatal care  significant for:  1) Low lying placenta --RESOLVED on 30 week [place] History of migraine  2)  h/o complex migraines and probable pseudotumor cerebri --no issues this pregnancy 3) Obesity-BMI 55 4) Gestational diabetes mellitus --failed early 1 hr glucola (153). started FS and diet BS controlled on Metformin 1500mg  QHS/500mg  AM Growth [place] at 36 weeks 30%ile Baseline HgbA1C 5.4 5) Group B Streptococcus carrier-Rx in labor   OB History    Gravida  4   Para  3   Term  3   Preterm  0   AB  0   Living  3     SAB  0   TAB  0   Ectopic  0   Multiple  0   Live Births  3          09-10-2008, 38.5 wks 1. M, 7lbs 12oz, Vaginal Delivery 12-26-2009, 38 wks 1. F, 8lbs 10oz, Vaginal Delivery 11-11-2017, 39.3 wks 1. F, 7lbs 12oz, Vaginal Delivery  Past Medical History:  Diagnosis Date  . Constipation   . Dyspnea    with exertion  . Gestational diabetes    metformin  . Headache    migraine-   . Optic disc edema   . Pseudotumor cerebri 01/16/2016   Past Surgical History:  Procedure Laterality Date  . APPENDECTOMY    . TOOTH EXTRACTION N/A 02/28/2017   Procedure: DENTAL EXTRACTIONS IMPACTED TEETH NUMBER SIXTEEN, SEVENTEEN, THIRTY-TWO;  Surgeon: Diona Browner, DDS;  Location: [facility];  Service: Oral Surgery;  Laterality: N/A;   Family History: family history includes Aneurysm in her father; Asthma in her brother; Breast cancer in her maternal grandmother; Diabetes in her mother; High blood pressure in an other family member; Hypertension in her mother; Spina bifida in her brother. Social History:  reports that she quit smoking about 2 years ago. Her smoking use included cigarettes. She smoked 0.25 packs per day. She has never used smokeless tobacco. She reports previous alcohol use of about 1.0 standard drinks of alcohol per  week. She reports that she does not use drugs.     Maternal Diabetes: Yes:  Diabetes Type:  Insulin/Medication controlled Genetic Screening: Normal Maternal Ultrasounds/Referrals: Normal Fetal Ultrasounds or other Referrals:  None Maternal Substance Abuse:  No Significant Maternal Medications:  Meds include: Other:  metformin Significant Maternal Lab Results:  Group B Strep positive Other Comments:  None  Review of Systems  Constitutional: Negative for fever.  Gastrointestinal: Negative for abdominal pain.   Maternal Medical History:  Contractions: Perceived severity is mild.    Fetal activity: Perceived fetal activity is normal.    Prenatal Complications - Diabetes: gestational.      unknown if currently breastfeeding. Maternal Exam:  Uterine Assessment: Contraction strength is mild.  Contraction frequency is irregular.   Abdomen: Patient reports no abdominal tenderness. Fetal presentation: vertex  Introitus: Normal vulva. Normal vagina.    Physical Exam  Constitutional: She appears well-developed and well-nourished.  Cardiovascular: Normal rate and regular rhythm.  Respiratory: Effort normal.  GI: Soft.  Genitourinary:    Vulva and vagina normal.   Neurological: She is alert.  Psychiatric: She has a normal mood and affect.    Prenatal labs: ABO, Rh: A/Positive/-- (10/26 0000) Antibody: Negative (10/26 0000) Rubella: Immune (10/26 0000) RPR: Nonreactive (10/26 0000)  HBsAg: Negative (10/26 0000)  HIV: Non-reactive (10/26 0000)  GBS: Positive/-- (10/26 0000)  One hour GCT 153 --started FS Panorama WNL Essential panel negative Hgb AA Assessment/Plan: Pt for IOL at term.  Will start with cytotec then change to pitocin. AROM after PCN on board and more favorable Epidural prn  Oliver Pila 07/28/2019, 9:05 PM

## 2019-07-28 NOTE — H&P (Deleted)
  The note originally documented on this encounter has been moved the the encounter in which it belongs.  

## 2019-07-29 ENCOUNTER — Inpatient Hospital Stay (HOSPITAL_COMMUNITY): Payer: Medicaid Other

## 2019-07-29 ENCOUNTER — Inpatient Hospital Stay (HOSPITAL_COMMUNITY)
Admission: AD | Admit: 2019-07-29 | Discharge: 2019-07-30 | DRG: 807 | Disposition: A | Discharge status: Home / Self Care | Payer: Medicaid Other | Attending: Obstetrics and Gynecology | Admitting: Obstetrics and Gynecology

## 2019-07-29 ENCOUNTER — Encounter (HOSPITAL_COMMUNITY): Payer: Self-pay | Admitting: Obstetrics and Gynecology

## 2019-07-29 ENCOUNTER — Inpatient Hospital Stay (HOSPITAL_COMMUNITY)
Admission: AD | Admit: 2019-07-29 | Payer: Medicaid Other | Source: Home / Self Care | Admitting: Obstetrics and Gynecology

## 2019-07-29 ENCOUNTER — Other Ambulatory Visit: Payer: Self-pay

## 2019-07-29 DIAGNOSIS — O24419 Gestational diabetes mellitus in pregnancy, unspecified control: Secondary | ICD-10-CM | POA: Diagnosis present

## 2019-07-29 DIAGNOSIS — O99215 Obesity complicating the puerperium: Secondary | ICD-10-CM | POA: Diagnosis present

## 2019-07-29 DIAGNOSIS — O99824 Streptococcus B carrier state complicating childbirth: Secondary | ICD-10-CM | POA: Diagnosis present

## 2019-07-29 DIAGNOSIS — O24425 Gestational diabetes mellitus in childbirth, controlled by oral hypoglycemic drugs: Principal | ICD-10-CM | POA: Diagnosis present

## 2019-07-29 DIAGNOSIS — Z87891 Personal history of nicotine dependence: Secondary | ICD-10-CM

## 2019-07-29 DIAGNOSIS — Z3A39 39 weeks gestation of pregnancy: Secondary | ICD-10-CM

## 2019-07-29 DIAGNOSIS — O26893 Other specified pregnancy related conditions, third trimester: Secondary | ICD-10-CM | POA: Diagnosis present

## 2019-07-29 LAB — CBC
HCT: 35.3 % — ABNORMAL LOW (ref 36.0–46.0)
Hemoglobin: 10.8 g/dL — ABNORMAL LOW (ref 12.0–15.0)
MCH: 23 pg — ABNORMAL LOW (ref 26.0–34.0)
MCHC: 30.6 g/dL (ref 30.0–36.0)
MCV: 75.1 fL — ABNORMAL LOW (ref 80.0–100.0)
Platelets: 292 10*3/uL (ref 150–400)
RBC: 4.7 MIL/uL (ref 3.87–5.11)
RDW: 16.3 % — ABNORMAL HIGH (ref 11.5–15.5)
WBC: 14.4 10*3/uL — ABNORMAL HIGH (ref 4.0–10.5)
nRBC: 0 % (ref 0.0–0.2)

## 2019-07-29 LAB — GLUCOSE, CAPILLARY
Glucose-Capillary: 131 mg/dL — ABNORMAL HIGH (ref 70–99)
Glucose-Capillary: 77 mg/dL (ref 70–99)

## 2019-07-29 LAB — RPR: RPR Ser Ql: NONREACTIVE

## 2019-07-29 LAB — TYPE AND SCREEN
ABO/RH(D): A POS
Antibody Screen: NEGATIVE

## 2019-07-29 LAB — ABO/RH: ABO/RH(D): A POS

## 2019-07-29 MED ORDER — ONDANSETRON HCL 4 MG PO TABS: 4.0000 mg | ORAL_TABLET | ORAL | Status: DC | PRN

## 2019-07-29 MED ORDER — OXYCODONE-ACETAMINOPHEN 5-325 MG PO TABS: 2.0000 | ORAL_TABLET | ORAL | Status: DC | PRN

## 2019-07-29 MED ORDER — SIMETHICONE 80 MG PO CHEW: 80.0000 mg | CHEWABLE_TABLET | ORAL | Status: DC | PRN

## 2019-07-29 MED ORDER — DIBUCAINE (PERIANAL) 1 % EX OINT: 1.0000 "application " | TOPICAL_OINTMENT | CUTANEOUS | Status: DC | PRN

## 2019-07-29 MED ORDER — PRENATAL MULTIVITAMIN CH
1.0000 | ORAL_TABLET | Freq: Every day | ORAL | Status: DC
  Administered 2019-07-30: 1 via ORAL
  Filled 2019-07-29: qty 1

## 2019-07-29 MED ORDER — SODIUM CHLORIDE 0.9 % IV SOLN
5.0000 10*6.[IU] | Freq: Once | INTRAVENOUS | Status: AC
  Administered 2019-07-29: 5 10*6.[IU] via INTRAVENOUS
  Filled 2019-07-29: qty 5

## 2019-07-29 MED ORDER — ACETAMINOPHEN 325 MG PO TABS
650.0000 mg | ORAL_TABLET | ORAL | Status: DC | PRN
  Administered 2019-07-29: 650 mg via ORAL
  Filled 2019-07-29: qty 2

## 2019-07-29 MED ORDER — ONDANSETRON HCL 4 MG/2ML IJ SOLN: 4.0000 mg | INTRAMUSCULAR | Status: DC | PRN

## 2019-07-29 MED ORDER — ONDANSETRON HCL 4 MG/2ML IJ SOLN: 4.0000 mg | Freq: Four times a day (QID) | INTRAMUSCULAR | Status: DC | PRN

## 2019-07-29 MED ORDER — SOD CITRATE-CITRIC ACID 500-334 MG/5ML PO SOLN: 30.0000 mL | ORAL | Status: DC | PRN

## 2019-07-29 MED ORDER — BENZOCAINE-MENTHOL 20-0.5 % EX AERO: 1.0000 "application " | INHALATION_SPRAY | CUTANEOUS | Status: DC | PRN

## 2019-07-29 MED ORDER — PENICILLIN G POT IN DEXTROSE 60000 UNIT/ML IV SOLN
3.0000 10*6.[IU] | INTRAVENOUS | Status: DC
  Administered 2019-07-29 (×2): 3 10*6.[IU] via INTRAVENOUS
  Filled 2019-07-29 (×2): qty 50

## 2019-07-29 MED ORDER — MISOPROSTOL 25 MCG QUARTER TABLET
25.0000 ug | ORAL_TABLET | ORAL | Status: AC | PRN
  Administered 2019-07-29 (×2): 25 ug via VAGINAL
  Filled 2019-07-29 (×2): qty 1

## 2019-07-29 MED ORDER — COCONUT OIL OIL: 1.0000 "application " | TOPICAL_OIL | Status: DC | PRN

## 2019-07-29 MED ORDER — OXYCODONE-ACETAMINOPHEN 5-325 MG PO TABS: 1.0000 | ORAL_TABLET | ORAL | Status: DC | PRN

## 2019-07-29 MED ORDER — SENNOSIDES-DOCUSATE SODIUM 8.6-50 MG PO TABS
2.0000 | ORAL_TABLET | ORAL | Status: DC
  Administered 2019-07-29: 2 via ORAL
  Filled 2019-07-29: qty 2

## 2019-07-29 MED ORDER — OXYTOCIN-SODIUM CHLORIDE 30-0.9 UT/500ML-% IV SOLN: 1.0000 m[IU]/min | INTRAVENOUS | Status: DC

## 2019-07-29 MED ORDER — LACTATED RINGERS IV SOLN: 500.0000 mL | INTRAVENOUS | Status: DC | PRN

## 2019-07-29 MED ORDER — BUTORPHANOL TARTRATE 1 MG/ML IJ SOLN: 1.0000 mg | INTRAMUSCULAR | Status: DC | PRN

## 2019-07-29 MED ORDER — WITCH HAZEL-GLYCERIN EX PADS: 1.0000 "application " | MEDICATED_PAD | CUTANEOUS | Status: DC | PRN

## 2019-07-29 MED ORDER — DIPHENHYDRAMINE HCL 25 MG PO CAPS: 25.0000 mg | ORAL_CAPSULE | Freq: Four times a day (QID) | ORAL | Status: DC | PRN

## 2019-07-29 MED ORDER — OXYTOCIN-SODIUM CHLORIDE 30-0.9 UT/500ML-% IV SOLN
2.5000 [IU]/h | INTRAVENOUS | Status: DC
  Filled 2019-07-29: qty 500

## 2019-07-29 MED ORDER — TERBUTALINE SULFATE 1 MG/ML IJ SOLN: 0.2500 mg | Freq: Once | INTRAMUSCULAR | Status: DC | PRN

## 2019-07-29 MED ORDER — TETANUS-DIPHTH-ACELL PERTUSSIS 5-2.5-18.5 LF-MCG/0.5 IM SUSP: 0.5000 mL | Freq: Once | INTRAMUSCULAR | Status: DC

## 2019-07-29 MED ORDER — OXYTOCIN BOLUS FROM INFUSION
500.0000 mL | Freq: Once | INTRAVENOUS | Status: AC
  Administered 2019-07-29: 500 mL via INTRAVENOUS

## 2019-07-29 MED ORDER — IBUPROFEN 600 MG PO TABS
600.0000 mg | ORAL_TABLET | Freq: Four times a day (QID) | ORAL | Status: DC
  Administered 2019-07-29 – 2019-07-30 (×5): 600 mg via ORAL
  Filled 2019-07-29 (×5): qty 1

## 2019-07-29 MED ORDER — LIDOCAINE HCL (PF) 1 % IJ SOLN: 30.0000 mL | INTRAMUSCULAR | Status: DC | PRN

## 2019-07-29 MED ORDER — ZOLPIDEM TARTRATE 5 MG PO TABS: 5.0000 mg | ORAL_TABLET | Freq: Every evening | ORAL | Status: DC | PRN

## 2019-07-29 MED ORDER — LACTATED RINGERS IV SOLN: INTRAVENOUS | Status: DC

## 2019-07-29 NOTE — Progress Notes (Signed)
Patient ID: Jessica Massey, female   DOB: 02-Dec-1990, 29 y.o.   MRN: [id number] Pt feeling some contractions s/p cytotec x 2, coping well  afeb VSS FHR Category 1  Cervix 70/3-4/-2   AROM clear  Follow progress Pt plans no pain meds but aware can have it desires

## 2019-07-29 NOTE — Plan of Care (Signed)

## 2019-07-30 LAB — CBC
HCT: 31.2 % — ABNORMAL LOW (ref 36.0–46.0)
Hemoglobin: 9.4 g/dL — ABNORMAL LOW (ref 12.0–15.0)
MCH: 23 pg — ABNORMAL LOW (ref 26.0–34.0)
MCHC: 30.1 g/dL (ref 30.0–36.0)
MCV: 76.5 fL — ABNORMAL LOW (ref 80.0–100.0)
Platelets: 271 10*3/uL (ref 150–400)
RBC: 4.08 MIL/uL (ref 3.87–5.11)
RDW: 16.4 % — ABNORMAL HIGH (ref 11.5–15.5)
WBC: 12.7 10*3/uL — ABNORMAL HIGH (ref 4.0–10.5)
nRBC: 0 % (ref 0.0–0.2)

## 2019-07-30 MED ORDER — IBUPROFEN 600 MG PO TABS: 600.0000 mg | ORAL_TABLET | Freq: Four times a day (QID) | ORAL | 1 refills | Status: DC | PRN

## 2019-07-30 NOTE — Discharge Summary (Signed)
Postpartum Discharge Summary  Date of Service updated      Patient Name: Jessica Massey DOB: March 30, 1990 MRN: [id number]  Date of admission: 07/29/2019 Delivery date:07/29/2019  Delivering provider: Chauncey Mann  Date of discharge: 07/30/2019  Admitting diagnosis: Gestational diabetes mellitus (GDM) affecting pregnancy [O24.419] Precipitous delivery, delivered (current hospitalization) [O62.3] Intrauterine pregnancy: [redacted]w[redacted]d    Secondary diagnosis:  Active Problems:   Gestational diabetes mellitus (GDM) affecting pregnancy   Precipitous delivery, delivered (current hospitalization)  Additional problems: none    Discharge diagnosis: Term Pregnancy Delivered and GDM A2                                              Post partum procedures:n/a Augmentation: AROM, Pitocin and Cytotec Complications: None  Hospital course: Induction of Labor With Vaginal Delivery   29y.o. yo [id number] admitted to the hospital 07/29/2019 for induction of labor.  Indication for induction: A2 DM.  Patient had an uncomplicated labor course as follows: Membrane Rupture Time/Date: 8:40 AM ,07/29/2019   Delivery Method:Vaginal, Spontaneous  Episiotomy: None  Lacerations:  None  Details of delivery can be found in separate delivery note.  Patient had a routine postpartum course. Patient is discharged home 07/30/19.  Newborn Data: Birth date:07/29/2019  Birth time:10:08 AM  Gender:Female  Living status:Living  Apgars:8 ,9  Weight:3560 g   Magnesium Sulfate received: No BMZ received: No Rhophylac:N/A MMR:N/A T-DaP:Given prenatally Flu: N/A Transfusion:No  Physical exam  Vitals:   07/29/19 1720 07/29/19 2106 07/30/19 0122 07/30/19 0511  BP: (!) 122/53 136/71 122/66 116/81  Pulse: 75 76 75 67  Resp: '18 18 18 18  '$ Temp: 98.2 F (36.8 C) 97.9 F (36.6 C) 97.7 F (36.5 C) 97.7 F (36.5 C)  TempSrc: Oral Oral Oral Oral  SpO2:  100% 100%   Weight:      Height:       General: alert,  cooperative and no distress Lochia: appropriate Uterine Fundus: firm Incision: N/A DVT Evaluation: No evidence of DVT seen on physical exam. Labs: Lab Results  Component Value Date   WBC 12.7 (H) 07/30/2019   HGB 9.4 (L) 07/30/2019   HCT 31.2 (L) 07/30/2019   MCV 76.5 (L) 07/30/2019   PLT 271 07/30/2019   CMP Latest Ref Rng & Units 04/20/2018  Glucose 70 - 99 mg/dL 118(H)  BUN 6 - 20 mg/dL 16  Creatinine 0.44 - 1.00 mg/dL 0.70  Sodium 135 - 145 mmol/L 137  Potassium 3.5 - 5.1 mmol/L 3.5  Chloride 98 - 111 mmol/L 105  CO2 22 - 32 mmol/L 24  Calcium 8.9 - 10.3 mg/dL 8.6(L)  Total Protein 6.5 - 8.1 g/dL 7.7  Total Bilirubin 0.3 - 1.2 mg/dL 0.9  Alkaline Phos 38 - 126 U/L 65  AST 15 - 41 U/L 19  ALT 0 - 44 U/L 20   Edinburgh Score: Edinburgh Postnatal Depression Scale Screening Tool 07/29/2019  I have been able to laugh and see the funny side of things. 0  I have looked forward with enjoyment to things. 0  I have blamed myself unnecessarily when things went wrong. 0  I have been anxious or worried for no good reason. 0  I have felt scared or panicky for no good reason. 0  Things have been getting on top of me. 0  I have  been so unhappy that I have had difficulty sleeping. 0  I have felt sad or miserable. 0  I have been so unhappy that I have been crying. 0  The thought of harming myself has occurred to me. 0  Edinburgh Postnatal Depression Scale Total 0      After visit meds:  Allergies as of 07/30/2019      Reactions   Latex Anaphylaxis, Hives      Medication List    STOP taking these medications   metFORMIN 500 MG tablet Commonly known as: GLUCOPHAGE   ondansetron 4 MG disintegrating tablet Commonly known as: Zofran ODT     TAKE these medications   ibuprofen 600 MG tablet Commonly known as: ADVIL Take 1 tablet (600 mg total) by mouth every 6 (six) hours as needed for cramping.        Discharge home in stable condition Infant Feeding: Breast Infant  Disposition:home with mother Discharge instruction: per After Visit Summary and Postpartum booklet. Activity: Advance as tolerated. Pelvic rest for 6 weeks.  Diet: carb modified diet Anticipated Birth Control: Unsure Postpartum Appointment:6 weeks Additional Postpartum F/U: 2 hour GTT Future Appointments:No future appointments. Follow up Visit: Follow-up Information    Paula Compton, MD. Schedule an appointment as soon as possible for a visit in 6 week(s).   Specialty: Obstetrics and Gynecology Why: For postpartum vsiit Contact information: [address] [place] [address] [phone]               07/30/2019 Jessica Serge, DO

## 2019-07-30 NOTE — Discharge Instructions (Signed)
Call office with any concerns [phone] 

## 2019-07-30 NOTE — Progress Notes (Signed)
Post Partum Day 1 Subjective: no complaints, up ad lib, voiding, tolerating PO, + flatus and lochia mild. Pt denies SOB, CP or HA. Pt is bonding well with baby - breastfeeding. She desirec circumcision for baby and discharge home today  Objective: Blood pressure 116/81, pulse 67, temperature 97.7 F (36.5 C), temperature source Oral, resp. rate 18, height 5\' 2"  (1.575 m), weight (!) 151.2 kg, SpO2 100 %, unknown if currently breastfeeding.  Physical Exam:  General: alert, cooperative and no distress Lochia: appropriate Uterine Fundus: firm Incision: n/a DVT Evaluation: No evidence of DVT seen on physical exam.  Recent Labs    07/29/19 0030 07/30/19 0438  HGB 10.8* 9.4*  HCT 35.3* 31.2*    Assessment/Plan: Discharge home, Breastfeeding and Circumcision prior to discharge  6 week postpartum visit   LOS: 1 day   Yovany Clock W Trevel Dillenbeck 07/30/2019, 10:01 AM

## 2019-09-04 ENCOUNTER — Encounter: Payer: Self-pay | Admitting: Emergency Medicine

## 2019-09-04 ENCOUNTER — Other Ambulatory Visit: Payer: Self-pay

## 2019-09-04 ENCOUNTER — Ambulatory Visit
Admission: EM | Admit: 2019-09-04 | Discharge: 2019-09-04 | Disposition: A | Discharge status: Home / Self Care | Payer: Medicaid Other | Attending: Emergency Medicine | Admitting: Emergency Medicine

## 2019-09-04 DIAGNOSIS — L301 Dyshidrosis [pompholyx]: Secondary | ICD-10-CM

## 2019-09-04 DIAGNOSIS — L2089 Other atopic dermatitis: Secondary | ICD-10-CM

## 2019-09-04 MED ORDER — FLUOCINONIDE 0.05 % EX OINT: 1.0000 "application " | TOPICAL_OINTMENT | Freq: Two times a day (BID) | CUTANEOUS | 0 refills | Status: DC

## 2019-09-04 MED ORDER — TRIAMCINOLONE ACETONIDE 0.1 % EX CREA
1.0000 | TOPICAL_CREAM | Freq: Two times a day (BID) | CUTANEOUS | 0 refills | Status: DC
Start: 2019-09-04 — End: 2020-12-23

## 2019-09-04 NOTE — ED Provider Notes (Signed)
[facility]   [id number] 09/04/19 Arrival Time: 0803  CC: Rash  SUBJECTIVE:  Jessica Massey is a 29 y.o. female who presents with eczema flare up on hands x 3 days.  Localizes the rash to hands.  Describes it as itchy.  Has tried OTC medications without relief.  Symptoms are made worse at night.  Reports similar symptoms in the past.   Denies fever, chills, nausea, vomiting,discharge, SOB, chest pain.  Recently had a baby and is currently breast feeding  ROS: As per HPI.  All other pertinent ROS negative.     Past Medical History:  Diagnosis Date  . Constipation   . Dyspnea    with exertion  . Gestational diabetes    metformin  . Headache    migraine-   . Optic disc edema   . Pseudotumor cerebri 01/16/2016   Past Surgical History:  Procedure Laterality Date  . APPENDECTOMY    . TOOTH EXTRACTION N/A 02/28/2017   Procedure: DENTAL EXTRACTIONS IMPACTED TEETH NUMBER SIXTEEN, SEVENTEEN, THIRTY-TWO;  Surgeon: Ocie Doyne, DDS;  Location: [facility];  Service: Oral Surgery;  Laterality: N/A;   Allergies  Allergen Reactions  . Latex Anaphylaxis and Hives   No current facility-administered medications on file prior to encounter.   Current Outpatient Medications on File Prior to Encounter  Medication Sig Dispense Refill  . ibuprofen (ADVIL) 600 MG tablet Take 1 tablet (600 mg total) by mouth every 6 (six) hours as needed for cramping. 40 tablet 1   Social History   Socioeconomic History  . Marital status: Single    Spouse name: Not on file  . Number of children: 2  . Years of education: 8th  . Highest education level: Not on file  Occupational History    Employer: [employer]  Tobacco Use  . Smoking status: Former Smoker    Packs/day: 0.25    Types: Cigarettes    Quit date: 12/2016    Years since quitting: 2.6  . Smokeless tobacco: Never Used  . Tobacco comment: 10  Vaping Use  . Vaping Use: Never used  Substance and Sexual Activity  . Alcohol  use: Not Currently    Alcohol/week: 1.0 standard drink    Types: 1 Shots of liquor per week    Comment: occasional social- 2 drinks  2 times a month- 02/27/17  . Drug use: No  . Sexual activity: Not on file  Other Topics Concern  . Not on file  Social History Narrative   Patient lives at home with her family.   Patient works at [employer].   Patient is right handed.   Caffeine - 32oz per day.   Social Determinants of Health   Financial Resource Strain:   . Difficulty of Paying Living Expenses:   Food Insecurity:   . Worried About Programme researcher, broadcasting/film/video in the Last Year:   . Barista in the Last Year:   Transportation Needs:   . Freight forwarder (Medical):   Marland Kitchen Lack of Transportation (Non-Medical):   Physical Activity:   . Days of Exercise per Week:   . Minutes of Exercise per Session:   Stress:   . Feeling of Stress :   Social Connections:   . Frequency of Communication with Friends and Family:   . Frequency of Social Gatherings with Friends and Family:   . Attends Religious Services:   . Active Member of Clubs or Organizations:   . Attends Banker Meetings:   .  Marital Status:   Intimate Partner Violence:   . Fear of Current or Ex-Partner:   . Emotionally Abused:   Marland Kitchen Physically Abused:   . Sexually Abused:    Family History  Problem Relation Age of Onset  . Hypertension Mother   . Diabetes Mother   . Aneurysm Father   . High blood pressure Other   . Breast cancer Maternal Grandmother   . Asthma Brother   . Spina bifida Brother     OBJECTIVE: Vitals:   09/04/19 0813 09/04/19 0814  BP: (!) 145/90   Pulse: 79   Resp: 18   Temp: 98.2 F (36.8 C)   TempSrc: Oral   SpO2: 94%   Weight:  (!) 320 lb (145.2 kg)    General appearance: alert; no distress Head: NCAT Lungs: normal respiratory effort Extremities: no edema Skin: warm and dry; erythematous papules diffuse about the palmar aspect of bilateral hands; NTTP, no obvious bleeding or  drainage, some scab formation and drying Psychological: alert and cooperative; normal mood and affect  ASSESSMENT & PLAN:  1. Other atopic dermatitis   2. Dyshidrotic eczema     Meds ordered this encounter  Medications  . fluocinonide ointment (LIDEX) 0.05 %    Sig: Apply 1 application topically 2 (two) times daily.    Dispense:  60 g    Refill:  0    Order Specific Question:   Supervising Provider    Answer:   Eustace Moore [[id number]]  . triamcinolone cream (KENALOG) 0.1 %    Sig: Apply 1 application topically 2 (two) times daily.    Dispense:  60 g    Refill:  0    Order Specific Question:   Supervising Provider    Answer:   Eustace Moore [[id number]]    Wash with warm water and mild soap Avoid hot showers or baths Moisturize skin daily Use lidex or fluocinonide ointment on hands.  Apply at night and wear thin cotton gloves to help absorb the ointment over night Triamcinolone or kenalog cream for other areas of eczema.  Avoid using greater than 2 weeks on face or flexural areas which may be prone to skin thinning Use OTC zyrtec, claritin, or allegra during the day Follow up with PCP as needed Return or go to the ED if you have any new or worsen symptoms such as fever, chills, nausea, vomiting, redness, swelling, discharge, oral lesions, shortness of breath, chest pain, abdominal pain, changes in bowel or bladder function, etc...   Reviewed expectations re: course of current medical issues. Questions answered. Outlined signs and symptoms indicating need for more acute intervention. Patient verbalized understanding. After Visit Summary given.   Alvino Chapel [place], PA-C 09/04/19 [phone]

## 2019-09-04 NOTE — ED Triage Notes (Signed)
Flare up of eczema since wednesday on hands.

## 2019-09-04 NOTE — Discharge Instructions (Addendum)
Wash with warm water and mild soap Avoid hot showers or baths Moisturize skin daily Use lidex or fluocinonide ointment on hands.  Apply at night and wear thin cotton gloves to help absorb the ointment over night Triamcinolone or kenalog cream for other areas of eczema.  Avoid using greater than 2 weeks on face or flexural areas which may be prone to skin thinning Use OTC zyrtec, claritin, or allegra during the day Follow up with PCP as needed Return or go to the ED if you have any new or worsen symptoms such as fever, chills, nausea, vomiting, redness, swelling, discharge, oral lesions, shortness of breath, chest pain, abdominal pain, changes in bowel or bladder function, etc..Marland Kitchen

## 2019-09-18 ENCOUNTER — Other Ambulatory Visit: Payer: Self-pay

## 2019-09-18 ENCOUNTER — Ambulatory Visit
Admission: EM | Admit: 2019-09-18 | Discharge: 2019-09-18 | Disposition: A | Discharge status: Home / Self Care | Payer: Medicaid Other | Attending: Emergency Medicine | Admitting: Emergency Medicine

## 2019-09-18 DIAGNOSIS — L301 Dyshidrosis [pompholyx]: Secondary | ICD-10-CM | POA: Diagnosis not present

## 2019-09-18 MED ORDER — PREDNISONE 20 MG PO TABS: 20.0000 mg | ORAL_TABLET | Freq: Two times a day (BID) | ORAL | 0 refills | Status: AC

## 2019-09-18 NOTE — ED Provider Notes (Signed)
[facility]   [id number] 09/18/19 Arrival Time: 0807  CC: Rash  SUBJECTIVE:  Jessica Massey is a 29 y.o. female who presents with acute on chronic eczema flare x 20 days.  Denies a precipitating event or trauma.  Was prescribed triamcinolone with relief.  Denies aggravating factors.  Reports similar symptoms in the past.  Denies fever, chills, nausea, vomiting, CP, SOB.    ROS: As per HPI.  All other pertinent ROS negative.     Past Medical History:  Diagnosis Date  . Constipation   . Dyspnea    with exertion  . Gestational diabetes    metformin  . Headache    migraine-   . Optic disc edema   . Pseudotumor cerebri 01/16/2016   Past Surgical History:  Procedure Laterality Date  . APPENDECTOMY    . TOOTH EXTRACTION N/A 02/28/2017   Procedure: DENTAL EXTRACTIONS IMPACTED TEETH NUMBER SIXTEEN, SEVENTEEN, THIRTY-TWO;  Surgeon: Ocie Doyne, DDS;  Location: [facility];  Service: Oral Surgery;  Laterality: N/A;   Allergies  Allergen Reactions  . Latex Anaphylaxis and Hives   No current facility-administered medications on file prior to encounter.   Current Outpatient Medications on File Prior to Encounter  Medication Sig Dispense Refill  . ibuprofen (ADVIL) 600 MG tablet Take 1 tablet (600 mg total) by mouth every 6 (six) hours as needed for cramping. 40 tablet 1  . triamcinolone cream (KENALOG) 0.1 % Apply 1 application topically 2 (two) times daily. 60 g 0   Social History   Socioeconomic History  . Marital status: Single    Spouse name: Not on file  . Number of children: 2  . Years of education: 8th  . Highest education level: Not on file  Occupational History    Employer: [employer]  Tobacco Use  . Smoking status: Former Smoker    Packs/day: 0.25    Types: Cigarettes    Quit date: 12/2016    Years since quitting: 2.7  . Smokeless tobacco: Never Used  . Tobacco comment: 10  Vaping Use  . Vaping Use: Never used  Substance and Sexual Activity    . Alcohol use: Not Currently    Alcohol/week: 1.0 standard drink    Types: 1 Shots of liquor per week    Comment: occasional social- 2 drinks  2 times a month- 02/27/17  . Drug use: No  . Sexual activity: Not on file  Other Topics Concern  . Not on file  Social History Narrative   Patient lives at home with her family.   Patient works at [employer].   Patient is right handed.   Caffeine - 32oz per day.   Social Determinants of Health   Financial Resource Strain:   . Difficulty of Paying Living Expenses:   Food Insecurity:   . Worried About Programme researcher, broadcasting/film/video in the Last Year:   . Barista in the Last Year:   Transportation Needs:   . Freight forwarder (Medical):   Marland Kitchen Lack of Transportation (Non-Medical):   Physical Activity:   . Days of Exercise per Week:   . Minutes of Exercise per Session:   Stress:   . Feeling of Stress :   Social Connections:   . Frequency of Communication with Friends and Family:   . Frequency of Social Gatherings with Friends and Family:   . Attends Religious Services:   . Active Member of Clubs or Organizations:   . Attends Banker  Meetings:   Marland Kitchen Marital Status:   Intimate Partner Violence:   . Fear of Current or Ex-Partner:   . Emotionally Abused:   Marland Kitchen Physically Abused:   . Sexually Abused:    Family History  Problem Relation Age of Onset  . Hypertension Mother   . Diabetes Mother   . Aneurysm Father   . High blood pressure Other   . Breast cancer Maternal Grandmother   . Asthma Brother   . Spina bifida Brother     OBJECTIVE: Vitals:   09/18/19 0817  BP: (!) 135/93  Pulse: 71  Resp: 18  Temp: 97.7 F (36.5 C)  TempSrc: Oral  SpO2: 96%    General appearance: alert; no distress Head: NCAT Lungs: normal respiratory effort Extremities: no edema Skin: warm and dry; erythematous papules diffuse about the palmar aspects of bilateral hands; NTTP, no obvious bleeding or drainage, some scab formation and  drying Psychological: alert and cooperative; normal mood and affect  ASSESSMENT & PLAN:  1. Dyshidrotic eczema     Meds ordered this encounter  Medications  . predniSONE (DELTASONE) 20 MG tablet    Sig: Take 1 tablet (20 mg total) by mouth 2 (two) times daily with a meal for 5 days.    Dispense:  10 tablet    Refill:  0    Order Specific Question:   Supervising Provider    Answer:   Eustace Moore [[id number]]   Continue with the triamcinolone Prednisone prescribed.  DO NOT breast feed while taking this medication Dermatology referral made.  Someone will be in contact with you regarding that information Return or go to the ER if you have any new or worsening symptoms such as fever, chills, nausea, vomiting, redness, swelling, discharge, if symptoms do not improve with medications, etc...  Reviewed expectations re: course of current medical issues. Questions answered. Outlined signs and symptoms indicating need for more acute intervention. Patient verbalized understanding. After Visit Summary given.   Alvino Chapel [place], PA-C 09/18/19 [phone]

## 2019-09-18 NOTE — ED Triage Notes (Signed)
Pt presents with chronic flare up of eczema on both hands; pt states she was prescribed a cream her last office visit and has not got any relief with it.

## 2019-09-18 NOTE — Discharge Instructions (Signed)
Continue with the triamcinolone Prednisone prescribed.  DO NOT breast feed while taking this medication Dermatology referral made.  Someone will be in contact with you regarding that information Return or go to the ER if you have any new or worsening symptoms such as fever, chills, nausea, vomiting, redness, swelling, discharge, if symptoms do not improve with medications, etc..Marland Kitchen

## 2020-02-26 NOTE — L&D Delivery Note (Signed)
Delivery Note She progressed to complete and pushed well.  At 4:51 PM a viable female was delivered via Vaginal, Spontaneous (Presentation: Left Occiput Anterior).  APGAR: 9, 9; weight pending.   Placenta status: Spontaneous, Intact.  Cord: 3 vessels with the following complications: None.  Anesthesia: None Episiotomy: None Lacerations: None Suture Repair:  none Est. Blood Loss (mL): 100  Mom to postpartum.  Baby to Couplet care / Skin to Skin.  They would like baby circumcised.  Leighton Roach Akisha Sturgill 12/28/2020, 5:27 PM

## 2020-06-01 LAB — OB RESULTS CONSOLE ABO/RH: "RH Type ": POSITIVE

## 2020-06-01 LAB — OB RESULTS CONSOLE ANTIBODY SCREEN: Antibody Screen: NEGATIVE

## 2020-06-01 LAB — OB RESULTS CONSOLE HEPATITIS B SURFACE ANTIGEN: Hepatitis B Surface Ag: NEGATIVE

## 2020-06-01 LAB — OB RESULTS CONSOLE HIV ANTIBODY (ROUTINE TESTING): HIV: NONREACTIVE

## 2020-06-01 LAB — OB RESULTS CONSOLE GC/CHLAMYDIA
Chlamydia: NEGATIVE
Gonorrhea: NEGATIVE

## 2020-06-01 LAB — OB RESULTS CONSOLE RPR: RPR: NONREACTIVE

## 2020-06-01 LAB — OB RESULTS CONSOLE RUBELLA ANTIBODY, IGM: Rubella: IMMUNE

## 2020-08-16 ENCOUNTER — Encounter: Payer: Self-pay | Admitting: Registered"

## 2020-08-16 ENCOUNTER — Other Ambulatory Visit: Payer: Self-pay

## 2020-08-16 ENCOUNTER — Encounter: Payer: 59 | Attending: Obstetrics and Gynecology | Admitting: Registered"

## 2020-08-16 DIAGNOSIS — O24419 Gestational diabetes mellitus in pregnancy, unspecified control: Secondary | ICD-10-CM

## 2020-08-16 NOTE — Progress Notes (Signed)
Patient was seen on 08/16/20 for Gestational Diabetes self-management class at the [facility]. The following learning objectives were met by the patient during this course:  States the definition of Gestational Diabetes States why dietary management is important in controlling blood glucose Describes the effects each nutrient has on blood glucose levels Demonstrates ability to create a balanced meal plan Demonstrates carbohydrate counting  States when to check blood glucose levels Demonstrates proper blood glucose monitoring techniques States the effect of stress and exercise on blood glucose levels States the importance of limiting caffeine and abstaining from alcohol and smoking  Blood glucose monitor given: Patient has meter and is checking blood sugar prior to class   Patient instructed to monitor glucose levels: FBS: 60 - <95; 1 hour: <140; 2 hour: <120  Patient received handouts: Nutrition Diabetes and Pregnancy, including carb counting list  Patient will be seen for follow-up as needed. 

## 2020-12-06 LAB — OB RESULTS CONSOLE GBS: GBS: POSITIVE

## 2020-12-21 ENCOUNTER — Telehealth (HOSPITAL_COMMUNITY): Payer: Self-pay | Admitting: *Deleted

## 2020-12-21 ENCOUNTER — Encounter (HOSPITAL_COMMUNITY): Payer: Self-pay

## 2020-12-21 ENCOUNTER — Encounter (HOSPITAL_COMMUNITY): Payer: Self-pay | Admitting: *Deleted

## 2020-12-21 NOTE — Telephone Encounter (Signed)
Preadmission screen  

## 2020-12-23 ENCOUNTER — Encounter (HOSPITAL_COMMUNITY): Payer: Self-pay | Admitting: Obstetrics and Gynecology

## 2020-12-23 ENCOUNTER — Inpatient Hospital Stay (HOSPITAL_COMMUNITY)
Admission: AD | Admit: 2020-12-23 | Discharge: 2020-12-23 | Disposition: A | Discharge status: Home / Self Care | Payer: 59 | Attending: Obstetrics and Gynecology | Admitting: Obstetrics and Gynecology

## 2020-12-23 ENCOUNTER — Other Ambulatory Visit: Payer: Self-pay

## 2020-12-23 DIAGNOSIS — Z0371 Encounter for suspected problem with amniotic cavity and membrane ruled out: Secondary | ICD-10-CM | POA: Diagnosis present

## 2020-12-23 DIAGNOSIS — O471 False labor at or after 37 completed weeks of gestation: Secondary | ICD-10-CM | POA: Insufficient documentation

## 2020-12-23 DIAGNOSIS — B3731 Acute candidiasis of vulva and vagina: Secondary | ICD-10-CM

## 2020-12-23 DIAGNOSIS — Z3A38 38 weeks gestation of pregnancy: Secondary | ICD-10-CM

## 2020-12-23 LAB — WET PREP, GENITAL
Clue Cells Wet Prep HPF POC: NONE SEEN
Sperm: NONE SEEN
Trich, Wet Prep: NONE SEEN

## 2020-12-23 LAB — AMNISURE RUPTURE OF MEMBRANE (ROM) NOT AT ARMC: Amnisure ROM: NEGATIVE

## 2020-12-23 LAB — POCT FERN TEST: POCT Fern Test: NEGATIVE

## 2020-12-23 MED ORDER — TERCONAZOLE 0.4 % VA CREA: 1.0000 | TOPICAL_CREAM | Freq: Every day | VAGINAL | 0 refills | Status: DC

## 2020-12-23 NOTE — MAU Note (Signed)
Pt reports to mau with c/o poss SROM of clear fluid around 1400.  Pt states she has had some green and yellow dc and a few episodes of trickling fluid.  Reports irreg ctx. +FM

## 2020-12-23 NOTE — MAU Note (Signed)
Rn called pt, pt not in lobby, support person reports shes in restroom

## 2020-12-23 NOTE — MAU Provider Note (Signed)
S: [name] is a 30 y.o. [id number] at [redacted]w[redacted]d  who presents to MAU today complaining of leaked a yellow-green blob of fluid at 1400. She denies vaginal bleeding. She denies contractions. She reports normal fetal movement.    O: BP 139/86 (BP Location: Right Arm)   Pulse 92   Temp 97.7 F (36.5 C) (Oral)   Resp 19   SpO2 97%  GENERAL: Well-developed, well-nourished female in no acute distress.  HEAD: Normocephalic, atraumatic.  CHEST: Normal effort of breathing, regular heart rate ABDOMEN: Soft, nontender, gravid PELVIC: Normal external female genitalia. Vagina is pink and rugated. Cervix with normal contour, no lesions. Normal discharge.  No pooling. Wet Prep obtained.  Cervical exam:  Dilation: 2.5 Effacement (%): 50 Station: -3, Ballotable Exam by:: R. Vincen Bejar,CNM   Fetal Monitoring: Baseline: 150 Variability: moderate Accelerations: present Decelerations: absent Contractions: UI noted  Fern Slide = negative  A: SIUP at [redacted]w[redacted]d  Membranes intact  P: Discharge home Keep scheduled appointment with [facility] on Wednesday 12/27/20 Patient verbalized an understanding of the plan of care and agrees.   Raelyn Mora, CNM 12/23/2020, 4:50 PM

## 2020-12-25 LAB — GC/CHLAMYDIA PROBE AMP (~~LOC~~) NOT AT ARMC
Chlamydia: NEGATIVE
Comment: NEGATIVE
Comment: NORMAL
Neisseria Gonorrhea: NEGATIVE

## 2020-12-26 ENCOUNTER — Other Ambulatory Visit: Payer: Self-pay | Admitting: Obstetrics and Gynecology

## 2020-12-27 ENCOUNTER — Encounter (HOSPITAL_COMMUNITY): Payer: Self-pay | Admitting: Obstetrics and Gynecology

## 2020-12-28 ENCOUNTER — Inpatient Hospital Stay (HOSPITAL_COMMUNITY)
Admission: AD | Admit: 2020-12-28 | Discharge: 2020-12-30 | DRG: 807 | Disposition: A | Discharge status: Home / Self Care | Payer: 59 | Attending: Obstetrics and Gynecology | Admitting: Obstetrics and Gynecology

## 2020-12-28 ENCOUNTER — Encounter (HOSPITAL_COMMUNITY): Payer: Self-pay | Admitting: Obstetrics and Gynecology

## 2020-12-28 ENCOUNTER — Inpatient Hospital Stay (HOSPITAL_COMMUNITY): Payer: 59

## 2020-12-28 ENCOUNTER — Other Ambulatory Visit: Payer: Self-pay

## 2020-12-28 DIAGNOSIS — O24425 Gestational diabetes mellitus in childbirth, controlled by oral hypoglycemic drugs: Principal | ICD-10-CM | POA: Diagnosis present

## 2020-12-28 DIAGNOSIS — O99824 Streptococcus B carrier state complicating childbirth: Secondary | ICD-10-CM | POA: Diagnosis present

## 2020-12-28 DIAGNOSIS — Z87891 Personal history of nicotine dependence: Secondary | ICD-10-CM

## 2020-12-28 DIAGNOSIS — Z3A39 39 weeks gestation of pregnancy: Secondary | ICD-10-CM | POA: Diagnosis not present

## 2020-12-28 DIAGNOSIS — O24415 Gestational diabetes mellitus in pregnancy, controlled by oral hypoglycemic drugs: Secondary | ICD-10-CM | POA: Diagnosis present

## 2020-12-28 HISTORY — DX: Depression, unspecified: F32.A

## 2020-12-28 LAB — CBC
HCT: 35.9 % — ABNORMAL LOW (ref 36.0–46.0)
Hemoglobin: 11.1 g/dL — ABNORMAL LOW (ref 12.0–15.0)
MCH: 23.5 pg — ABNORMAL LOW (ref 26.0–34.0)
MCHC: 30.9 g/dL (ref 30.0–36.0)
MCV: 75.9 fL — ABNORMAL LOW (ref 80.0–100.0)
Platelets: 309 10*3/uL (ref 150–400)
RBC: 4.73 MIL/uL (ref 3.87–5.11)
RDW: 15.6 % — ABNORMAL HIGH (ref 11.5–15.5)
WBC: 12.5 10*3/uL — ABNORMAL HIGH (ref 4.0–10.5)
nRBC: 0 % (ref 0.0–0.2)

## 2020-12-28 LAB — GLUCOSE, CAPILLARY
Glucose-Capillary: 185 mg/dL — ABNORMAL HIGH (ref 70–99)
Glucose-Capillary: 73 mg/dL (ref 70–99)
Glucose-Capillary: 80 mg/dL (ref 70–99)

## 2020-12-28 LAB — TYPE AND SCREEN
ABO/RH(D): A POS
Antibody Screen: NEGATIVE

## 2020-12-28 LAB — RPR: RPR Ser Ql: NONREACTIVE

## 2020-12-28 MED ORDER — ACETAMINOPHEN 325 MG PO TABS: 650.0000 mg | ORAL_TABLET | ORAL | Status: DC | PRN

## 2020-12-28 MED ORDER — MAGNESIUM HYDROXIDE 400 MG/5ML PO SUSP: 30.0000 mL | ORAL | Status: DC | PRN

## 2020-12-28 MED ORDER — BUTORPHANOL TARTRATE 1 MG/ML IJ SOLN: 1.0000 mg | INTRAMUSCULAR | Status: DC | PRN

## 2020-12-28 MED ORDER — OXYCODONE HCL 5 MG PO TABS: 10.0000 mg | ORAL_TABLET | ORAL | Status: DC | PRN

## 2020-12-28 MED ORDER — METHYLERGONOVINE MALEATE 0.2 MG/ML IJ SOLN: 0.2000 mg | INTRAMUSCULAR | Status: DC | PRN

## 2020-12-28 MED ORDER — SENNOSIDES-DOCUSATE SODIUM 8.6-50 MG PO TABS
2.0000 | ORAL_TABLET | Freq: Every day | ORAL | Status: DC
  Administered 2020-12-29 – 2020-12-30 (×2): 2 via ORAL
  Filled 2020-12-28 (×2): qty 2

## 2020-12-28 MED ORDER — TERBUTALINE SULFATE 1 MG/ML IJ SOLN: 0.2500 mg | Freq: Once | INTRAMUSCULAR | Status: DC | PRN

## 2020-12-28 MED ORDER — WITCH HAZEL-GLYCERIN EX PADS: 1.0000 "application " | MEDICATED_PAD | CUTANEOUS | Status: DC | PRN

## 2020-12-28 MED ORDER — BENZOCAINE-MENTHOL 20-0.5 % EX AERO: 1.0000 "application " | INHALATION_SPRAY | CUTANEOUS | Status: DC | PRN

## 2020-12-28 MED ORDER — IBUPROFEN 600 MG PO TABS
600.0000 mg | ORAL_TABLET | Freq: Four times a day (QID) | ORAL | Status: DC
  Administered 2020-12-28 – 2020-12-30 (×7): 600 mg via ORAL
  Filled 2020-12-28 (×7): qty 1

## 2020-12-28 MED ORDER — ONDANSETRON HCL 4 MG/2ML IJ SOLN: 4.0000 mg | INTRAMUSCULAR | Status: DC | PRN

## 2020-12-28 MED ORDER — ONDANSETRON HCL 4 MG/2ML IJ SOLN: 4.0000 mg | Freq: Four times a day (QID) | INTRAMUSCULAR | Status: DC | PRN

## 2020-12-28 MED ORDER — OXYCODONE HCL 5 MG PO TABS: 5.0000 mg | ORAL_TABLET | ORAL | Status: DC | PRN

## 2020-12-28 MED ORDER — PRENATAL MULTIVITAMIN CH
1.0000 | ORAL_TABLET | Freq: Every day | ORAL | Status: DC
  Administered 2020-12-29 – 2020-12-30 (×2): 1 via ORAL
  Filled 2020-12-28 (×2): qty 1

## 2020-12-28 MED ORDER — DIPHENHYDRAMINE HCL 25 MG PO CAPS: 25.0000 mg | ORAL_CAPSULE | Freq: Four times a day (QID) | ORAL | Status: DC | PRN

## 2020-12-28 MED ORDER — ZOLPIDEM TARTRATE 5 MG PO TABS: 5.0000 mg | ORAL_TABLET | Freq: Every evening | ORAL | Status: DC | PRN

## 2020-12-28 MED ORDER — SOD CITRATE-CITRIC ACID 500-334 MG/5ML PO SOLN: 30.0000 mL | ORAL | Status: DC | PRN

## 2020-12-28 MED ORDER — LACTATED RINGERS IV SOLN: 500.0000 mL | INTRAVENOUS | Status: DC | PRN

## 2020-12-28 MED ORDER — PENICILLIN G POT IN DEXTROSE 60000 UNIT/ML IV SOLN
3.0000 10*6.[IU] | INTRAVENOUS | Status: DC
  Administered 2020-12-28 (×2): 3 10*6.[IU] via INTRAVENOUS
  Filled 2020-12-28: qty 50

## 2020-12-28 MED ORDER — OXYCODONE-ACETAMINOPHEN 5-325 MG PO TABS: 2.0000 | ORAL_TABLET | ORAL | Status: DC | PRN

## 2020-12-28 MED ORDER — COCONUT OIL OIL: 1.0000 "application " | TOPICAL_OIL | Status: DC | PRN

## 2020-12-28 MED ORDER — OXYTOCIN-SODIUM CHLORIDE 30-0.9 UT/500ML-% IV SOLN: 2.5000 [IU]/h | INTRAVENOUS | Status: DC

## 2020-12-28 MED ORDER — LACTATED RINGERS IV SOLN: INTRAVENOUS | Status: DC

## 2020-12-28 MED ORDER — ONDANSETRON HCL 4 MG PO TABS: 4.0000 mg | ORAL_TABLET | ORAL | Status: DC | PRN

## 2020-12-28 MED ORDER — METHYLERGONOVINE MALEATE 0.2 MG PO TABS: 0.2000 mg | ORAL_TABLET | ORAL | Status: DC | PRN

## 2020-12-28 MED ORDER — TETANUS-DIPHTH-ACELL PERTUSSIS 5-2.5-18.5 LF-MCG/0.5 IM SUSY: 0.5000 mL | PREFILLED_SYRINGE | Freq: Once | INTRAMUSCULAR | Status: DC

## 2020-12-28 MED ORDER — DIBUCAINE (PERIANAL) 1 % EX OINT: 1.0000 "application " | TOPICAL_OINTMENT | CUTANEOUS | Status: DC | PRN

## 2020-12-28 MED ORDER — OXYTOCIN-SODIUM CHLORIDE 30-0.9 UT/500ML-% IV SOLN
1.0000 m[IU]/min | INTRAVENOUS | Status: DC
  Administered 2020-12-28: 2 m[IU]/min via INTRAVENOUS
  Filled 2020-12-28: qty 500

## 2020-12-28 MED ORDER — LIDOCAINE HCL (PF) 1 % IJ SOLN: 30.0000 mL | INTRAMUSCULAR | Status: DC | PRN

## 2020-12-28 MED ORDER — MEASLES, MUMPS & RUBELLA VAC IJ SOLR: 0.5000 mL | Freq: Once | INTRAMUSCULAR | Status: DC

## 2020-12-28 MED ORDER — SODIUM CHLORIDE 0.9 % IV SOLN
5.0000 10*6.[IU] | Freq: Once | INTRAVENOUS | Status: AC
  Administered 2020-12-28: 5 10*6.[IU] via INTRAVENOUS
  Filled 2020-12-28: qty 5

## 2020-12-28 MED ORDER — SIMETHICONE 80 MG PO CHEW: 80.0000 mg | CHEWABLE_TABLET | ORAL | Status: DC | PRN

## 2020-12-28 MED ORDER — OXYTOCIN BOLUS FROM INFUSION
333.0000 mL | Freq: Once | INTRAVENOUS | Status: AC
  Administered 2020-12-28: 333 mL via INTRAVENOUS

## 2020-12-28 MED ORDER — OXYCODONE-ACETAMINOPHEN 5-325 MG PO TABS: 1.0000 | ORAL_TABLET | ORAL | Status: DC | PRN

## 2020-12-28 NOTE — H&P (Signed)
Jessica Massey is a 30 y.o. female, G5 P4004, EGA [redacted] weeks with EDC 11-9 presenting for induction.  PNC complicated by A2GDM controlled with pm Metformin, normal BPPs.  OB History     Gravida  5   Para  4   Term  4   Preterm  0   AB  0   Living  4      SAB  0   IAB  0   Ectopic  0   Multiple  0   Live Births  4          Past Medical History:  Diagnosis Date   Constipation    Depression    Dyspnea    with exertion   Gestational diabetes    metformin   Headache    migraine-    Optic disc edema    Pseudotumor cerebri 01/16/2016   Past Surgical History:  Procedure Laterality Date   APPENDECTOMY     TOOTH EXTRACTION N/A 02/28/2017   Procedure: DENTAL EXTRACTIONS IMPACTED TEETH NUMBER SIXTEEN, SEVENTEEN, THIRTY-TWO;  Surgeon: Ocie Doyne, DDS;  Location: [facility] OR;  Service: Oral Surgery;  Laterality: N/A;   Family History: family history includes Aneurysm in her father; Asthma in her brother; Breast cancer in her maternal grandmother; Diabetes in her mother; High blood pressure in an other family member; Hypertension in her mother; Spina bifida in her brother. Social History:  reports that she quit smoking about 4 years ago. Her smoking use included cigarettes. She smoked an average of .25 packs per day. She has never used smokeless tobacco. She reports that she does not currently use alcohol after a past usage of about 1.0 standard drink per week. She reports that she does not use drugs.     Maternal Diabetes: Yes:  Diabetes Type:  Insulin/Medication controlled Genetic Screening: Declined Maternal Ultrasounds/Referrals: Normal Fetal Ultrasounds or other Referrals:  None Maternal Substance Abuse:  No Significant Maternal Medications:  Meds include: Other:  Significant Maternal Lab Results:  Group B Strep positive Other Comments:   Metformin  Review of Systems  Respiratory: Negative.    Cardiovascular: Negative.   Maternal Medical History:  Contractions:  Perceived severity is mild.   Fetal activity: Perceived fetal activity is normal.   Prenatal complications: no prenatal complications Prenatal Complications - Diabetes: gestational. Diabetes is managed by oral agent (monotherapy).      unknown if currently breastfeeding. Maternal Exam:  Uterine Assessment: Contraction strength is mild.  Contraction frequency is irregular.  Abdomen: Patient reports no abdominal tenderness. Estimated fetal weight is 8.5 lbs.   Fetal presentation: vertex Introitus: Normal vulva. Normal vagina.  Amniotic fluid character: not assessed. Pelvis: adequate for delivery.     Fetal Exam Fetal Monitor Review: Mode: ultrasound.   Baseline rate: 150.  Variability: moderate (6-25 bpm).   Pattern: accelerations present and no decelerations.   Fetal State Assessment: Category I - tracings are normal.  Physical Exam Vitals reviewed.  Constitutional:      Appearance: Normal appearance.  Cardiovascular:     Rate and Rhythm: Normal rate and regular rhythm.  Pulmonary:     Effort: Pulmonary effort is normal. No respiratory distress.  Abdominal:     Palpations: Abdomen is soft.  Genitourinary:    General: Normal vulva.  Neurological:     Mental Status: She is alert.    Prenatal labs: ABO, Rh: A/Positive/-- (04/07 0000) Antibody: Negative (04/07 0000) Rubella: Immune (04/07 0000) RPR: Nonreactive (04/07 0000)  HBsAg: Negative (04/07  0000)  HIV: Non-reactive (04/07 0000)  GBS: Positive/-- (10/12 0000)   Assessment/Plan: IUP at 39 weeks, A2GDM, GBS pos for induction.  On pitocin and PCN, will AROM after 2nd dose of PCN and anticipate SVD, follow CBG   Leighton Roach Zuley Lutter 12/28/2020, 9:03 AM

## 2020-12-28 NOTE — Progress Notes (Signed)
Feeling some ctx Afeb, VSS FHT-150s, Cat I, ctx q 2-4 min VE-5/70/-1, vtx, AROM clear CBG 185, 73 Continue pitocin and PCN, anticipate SVD

## 2020-12-29 NOTE — Progress Notes (Signed)
Vitals:   12/29/20 0902 12/29/20 1034 12/29/20 1101 12/29/20 1313  BP: 137/88 139/88 121/65 129/85  Pulse: 80 77 77 78  Resp: 18     Temp: 98.7 F (37.1 C)   97.8 F (36.6 C)  TempSrc: Oral   Oral  SpO2:    99%  Weight:      Height:         Some borderline BPs today but mostly normal now.  Continue q 2 hour until MN and then routine.  Glucose this am was 80.

## 2020-12-29 NOTE — Progress Notes (Signed)
Unable to do circ, baby's sugars being watched.

## 2020-12-29 NOTE — Discharge Summary (Signed)
Postpartum Discharge Summary  Date of Service updated      Patient Name: Jessica Massey DOB: 1990/06/26 MRN: [id number]  Date of admission: 12/28/2020 Delivery date:12/28/2020  Delivering provider: Willis Modena, TODD  Date of discharge: 12/29/2020  Admitting diagnosis: Gestational diabetes mellitus (GDM) controlled on oral hypoglycemic drug, antepartum [O24.415] Intrauterine pregnancy: [redacted]w[redacted]d     Secondary diagnosis:  Active Problems:   Gestational diabetes mellitus (GDM) controlled on oral hypoglycemic drug, antepartum  Additional problems: A2GDM    Discharge diagnosis: Term Pregnancy Delivered and GDM A2                                              Post partum procedures: none Augmentation: AROM and Pitocin Complications: None  Hospital course: Induction of Labor With Vaginal Delivery   30 y.o. yo G5P5005 at [redacted]w[redacted]d was admitted to the hospital 12/28/2020 for induction of labor.  Indication for induction: A2 DM.  Patient had an uncomplicated labor course as follows: Membrane Rupture Time/Date: 2:40 PM ,12/28/2020   Delivery Method:Vaginal, Spontaneous  Episiotomy: None  Lacerations:  None  Details of delivery can be found in separate delivery note.  Patient had a routine postpartum course. Patient is discharged home 12/29/20.  Newborn Data: Birth date:12/28/2020  Birth time:4:51 PM  Gender:Female  Living status:Living  Apgars:9 ,9  Weight:4050 g   Magnesium Sulfate received: No BMZ received: No Rhophylac:No MMR:No T-DaP:Given prenatally Flu: No Transfusion:No  Physical exam  Vitals:   12/28/20 1920 12/28/20 2331 12/29/20 0445 12/29/20 0531  BP: 132/81 119/71 134/88 123/74  Pulse: 80 80 74 69  Resp: 17 18    Temp: 99.2 F (37.3 C) 98.7 F (37.1 C) 97.8 F (36.6 C)   TempSrc: Oral Oral Oral   SpO2: 98% 98% 98%   Weight:      Height:       Labs: Lab Results  Component Value Date   WBC 12.5 (H) 12/28/2020   HGB 11.1 (L) 12/28/2020   HCT 35.9 (L) 12/28/2020    MCV 75.9 (L) 12/28/2020   PLT 309 12/28/2020   CMP Latest Ref Rng & Units 04/20/2018  Glucose 70 - 99 mg/dL 118(H)  BUN 6 - 20 mg/dL 16  Creatinine 0.44 - 1.00 mg/dL 0.70  Sodium 135 - 145 mmol/L 137  Potassium 3.5 - 5.1 mmol/L 3.5  Chloride 98 - 111 mmol/L 105  CO2 22 - 32 mmol/L 24  Calcium 8.9 - 10.3 mg/dL 8.6(L)  Total Protein 6.5 - 8.1 g/dL 7.7  Total Bilirubin 0.3 - 1.2 mg/dL 0.9  Alkaline Phos 38 - 126 U/L 65  AST 15 - 41 U/L 19  ALT 0 - 44 U/L 20   Edinburgh Score: Edinburgh Postnatal Depression Scale Screening Tool 07/29/2019  I have been able to laugh and see the funny side of things. 0  I have looked forward with enjoyment to things. 0  I have blamed myself unnecessarily when things went wrong. 0  I have been anxious or worried for no good reason. 0  I have felt scared or panicky for no good reason. 0  Things have been getting on top of me. 0  I have been so unhappy that I have had difficulty sleeping. 0  I have felt sad or miserable. 0  I have been so unhappy that I have been crying. 0  The thought  of harming myself has occurred to me. 0  Edinburgh Postnatal Depression Scale Total 0      After visit meds:  Allergies as of 12/29/2020       Reactions   Latex Anaphylaxis, Hives        Medication List     STOP taking these medications    metFORMIN 1000 MG tablet Commonly known as: GLUCOPHAGE       TAKE these medications    terconazole 0.4 % vaginal cream Commonly known as: TERAZOL 7 Place 1 applicator vaginally at bedtime. Use for seven days         Discharge home in stable condition Infant Feeding:  ? Infant Disposition:home with mother Discharge instruction: per After Visit Summary and Postpartum booklet. Activity: Advance as tolerated. Pelvic rest for 6 weeks.  Diet: routine diet Anticipated Birth Control: Unsure Postpartum Appointment:4 weeks Additional Postpartum F/U:  none Future Appointments:No future appointments. Follow up  Visit:  Follow-up Information     Meisinger, Todd, MD Follow up in 4 week(s).   Specialty: Obstetrics and Gynecology Contact information: [address] [place] [place] [address] [phone]                     12/29/2020 Daria Pastures, MD

## 2020-12-29 NOTE — Progress Notes (Signed)
Spoke to Dr. Henderson Cloud [phone]) about BP's being borderline (greater than or near 135/85).  We have checked them in both the upper arm and in the lower arm/forearm.  Pt has larger upper arm, we are using the Extra large/maroon colored BP cuff.  Pt denies any symptoms (HA, vision changes, sharp epigastric pain).  States she has never had any problems with her BP.    For now we are continuing to observe pt and continuing routine care/ VS q8hr.

## 2020-12-29 NOTE — Progress Notes (Addendum)
Patient is eating, ambulating, voiding.  Pain control is good.  Vitals:   12/28/20 1920 12/28/20 2331 12/29/20 0445 12/29/20 0531  BP: 132/81 119/71 134/88 123/74  Pulse: 80 80 74 69  Resp: 17 18    Temp: 99.2 F (37.3 C) 98.7 F (37.1 C) 97.8 F (36.6 C)   TempSrc: Oral Oral Oral   SpO2: 98% 98% 98%   Weight:      Height:        Fundus firm Perineum without swelling.  Lab Results  Component Value Date   WBC 12.5 (H) 12/28/2020   HGB 11.1 (L) 12/28/2020   HCT 35.9 (L) 12/28/2020   MCV 75.9 (L) 12/28/2020   PLT 309 12/28/2020    --/--/A POS (11/03 0750)/RI  A/P Post partum day 1.  Routine care.  Expect d/c tomorrow.   Parents desires circumsision.  All risks, benefits and alternatives discussed with the mother.  F/u DM in office, pp glucose normal.  Jessica Massey

## 2020-12-29 NOTE — Progress Notes (Signed)
CSW received consult for hx of Depression.  CSW met with MOB to offer support and complete assessment, FOB was present. CSW asked FOB to leave to speak with MOB privately, FOB left the room. MOB was welcoming, open, pleasant, and remained engaged during assessment. MOB denied any history of depression. MOB reported that she experienced postpartum depression after having her third child in 2019. MOB denied any postpartum depression after having her fourth child. MOB reported that she participated in therapy to treat her postpartum depression, which was helpful. MOB shared about going through life stressors during that time and her therapist assisting her in processing those stressors. MOB denied any additional mental health history. CSW inquired about how MOB was feeling emotionally since giving birth, MOB reported that she was feeling fine. MOB presented calm and did not demonstrate any acute mental health signs/symptoms. CSW assessed for safety, MOB denied SI, HI, and domestic violence. CSW inquired about MOB's support system, MOB reported that FOB is a support.   CSW provided education regarding the baby blues period vs. perinatal mood disorders, discussed treatment and gave resources for mental health follow up if concerns arise.  CSW recommends self-evaluation during the postpartum time period using the New Mom Checklist from Postpartum Progress and encouraged MOB to contact a medical professional if symptoms are noted at any time.    CSW provided review of Sudden Infant Death Syndrome (SIDS) precautions. MOB verbalized understanding and reported that they have all items needed to care for infant including a car seat and basinet.   CSW identifies no further need for intervention and no barriers to discharge at this time.  Nevyn Bossman, LCSW Clinical Social Worker [facility] Cell#: [phone]  

## 2020-12-30 NOTE — Discharge Summary (Signed)
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 yo G5P5005 at 32w1das admitted to the hospital 12/28/2020 for induction of labor.  Indication for induction: A2 DM.  Patient had an uncomplicated labor course as follows: Membrane Rupture Time/Date: 2:40 PM ,12/28/2020   Delivery Method:Vaginal, Spontaneous  Episiotomy: None  Lacerations:  None  Details of delivery can be found in separate delivery note.  Patient had a routine postpartum course. Patient is discharged home 12/30/20.  Newborn Data: Birth date:12/28/2020  Birth time:4:51 PM  Gender:Female  Living status:Living  Apgars:9 ,9  Weight:4050 g   Magnesium Sulfate received: No BMZ received: No Rhophylac:N/A MMR:N/A T-DaP:   Flu: N/A Transfusion:No  Physical exam  Vitals:   12/29/20 1101 12/29/20 1313 12/30/20 0010 12/30/20 0550  BP: 121/65 129/85 125/83 110/73  Pulse: 77 78 86 71  Resp:   18 16  Temp:  97.8 F (36.6 C) 98 F (36.7 C) 98.6 F (37 C)  TempSrc:  Oral Oral Oral  SpO2:  99% 100% 99%  Weight:      Height:       General: alert, cooperative, and no distress Lochia: appropriate Uterine Fundus: firm Incision: N/A DVT Evaluation: No evidence of DVT  seen on physical exam. Labs: Lab Results  Component Value Date   WBC 12.5 (H) 12/28/2020   HGB 11.1 (L) 12/28/2020   HCT 35.9 (L) 12/28/2020   MCV 75.9 (L) 12/28/2020   PLT 309 12/28/2020   CMP Latest Ref Rng & Units 04/20/2018  Glucose 70 - 99 mg/dL 118(H)  BUN 6 - 20 mg/dL 16  Creatinine 0.44 - 1.00 mg/dL 0.70  Sodium 135 - 145 mmol/L 137  Potassium 3.5 - 5.1 mmol/L 3.5  Chloride 98 - 111 mmol/L 105  CO2 22 - 32 mmol/L 24  Calcium 8.9 - 10.3 mg/dL 8.6(L)  Total Protein 6.5 - 8.1 g/dL 7.7  Total Bilirubin 0.3 - 1.2 mg/dL 0.9  Alkaline Phos 38 - 126 U/L 65  AST 15 - 41 U/L 19  ALT 0 - 44 U/L 20   Edinburgh Score: Edinburgh Postnatal Depression Scale Screening Tool 12/30/2020  I have been able to laugh and see the funny side of things. 0  I have looked forward with enjoyment to things. 0  I have blamed myself unnecessarily when things went wrong. 0  I have been anxious or worried for no good reason. 0  I have felt scared or panicky for no good reason. 0  Things have been getting on top of me. 0  I have been so unhappy that I have had difficulty sleeping. 0  I have felt sad or miserable. 0  I have been so unhappy that I have been crying. 0  The thought of harming myself has occurred to me. 0  Edinburgh Postnatal Depression Scale Total 0      After visit meds:  Allergies as of 12/30/2020       Reactions   Latex Anaphylaxis, Hives        Medication List     STOP taking these medications    metFORMIN 1000 MG tablet Commonly known as: GLUCOPHAGE       TAKE these medications    terconazole 0.4 % vaginal cream Commonly known as: TERAZOL 7 Place 1 applicator vaginally at bedtime. Use for seven days         Discharge home in stable condition Infant Feeding:  see peds note Infant Disposition:home with mother Discharge instruction: per After Visit Summary and Postpartum booklet. Activity: Advance as tolerated. Pelvic rest for 6 weeks.  Diet: routine  diet Anticipated Birth Control: Unsure Postpartum Appointment:4 weeks Additional Postpartum F/U: Postpartum Depression checkup Future Appointments:No future appointments. Follow up Visit:  Follow-up Information     Meisinger, Todd, MD Follow up in 4 week(s).   Specialty: Obstetrics and Gynecology Contact information: [address], [address] [place] [address] [phone]                     12/30/2020 Allyn Kenner, DO

## 2021-01-11 ENCOUNTER — Telehealth (HOSPITAL_COMMUNITY): Payer: Self-pay | Admitting: *Deleted

## 2021-01-11 NOTE — Telephone Encounter (Signed)
Mom reports feeling great! No concerns about herself at this time. EPDS=0 (hospital score=0) Mom reports baby is doing well. Feeding, peeing, and pooping without difficulty. Sleeps in bassinet on back. Reviewed safe sleep. Mom has no concerns about baby at present.  Duffy Rhody, RN 01-11-2021 at 2:15pm

## 2021-07-16 ENCOUNTER — Encounter (HOSPITAL_BASED_OUTPATIENT_CLINIC_OR_DEPARTMENT_OTHER): Payer: Self-pay

## 2021-07-16 DIAGNOSIS — R0683 Snoring: Secondary | ICD-10-CM

## 2021-07-16 DIAGNOSIS — R5383 Other fatigue: Secondary | ICD-10-CM

## 2022-12-10 ENCOUNTER — Ambulatory Visit
Admission: EM | Admit: 2022-12-10 | Discharge: 2022-12-10 | Disposition: A | Discharge status: Home / Self Care | Payer: Medicaid Other | Attending: Family Medicine | Admitting: Family Medicine

## 2022-12-10 ENCOUNTER — Encounter: Payer: Self-pay | Admitting: Emergency Medicine

## 2022-12-10 ENCOUNTER — Other Ambulatory Visit: Payer: Self-pay

## 2022-12-10 DIAGNOSIS — R112 Nausea with vomiting, unspecified: Secondary | ICD-10-CM | POA: Diagnosis not present

## 2022-12-10 DIAGNOSIS — Z87891 Personal history of nicotine dependence: Secondary | ICD-10-CM | POA: Diagnosis not present

## 2022-12-10 DIAGNOSIS — R109 Unspecified abdominal pain: Secondary | ICD-10-CM | POA: Diagnosis not present

## 2022-12-10 LAB — POCT URINALYSIS DIP (MANUAL ENTRY)
Glucose, UA: NEGATIVE mg/dL
Nitrite, UA: NEGATIVE
Protein Ur, POC: 30 mg/dL — AB
Spec Grav, UA: 1.015 (ref 1.010–1.025)
Urobilinogen, UA: 4 U/dL — AB
pH, UA: 6.5 (ref 5.0–8.0)

## 2022-12-10 MED ORDER — ONDANSETRON 4 MG PO TBDP: 4.0000 mg | ORAL_TABLET | Freq: Three times a day (TID) | ORAL | 0 refills | Status: DC | PRN

## 2022-12-10 MED ORDER — SULFAMETHOXAZOLE-TRIMETHOPRIM 800-160 MG PO TABS: 1.0000 | ORAL_TABLET | Freq: Two times a day (BID) | ORAL | 0 refills | Status: DC

## 2022-12-10 NOTE — ED Provider Notes (Signed)
[facility]    CSN: [id number] Arrival date & time: 12/10/22  1715      History   Chief Complaint Chief Complaint  Patient presents with   Back Pain    HPI Jessica Massey is a 32 y.o. female.   Patient presenting today with 3 to 4-day history of progressively worsening right flank pain, fever, chills, decreased appetite, fatigue, urinary urgency.  Was seen at a different urgent care this morning, diagnosed with a possible kidney stone due to blood and protein in urine and flank pain.  Was given Flomax which she has started.  States she started her menstrual cycle shortly after but does not feel this would be related to any of her symptoms.  She is requesting a second opinion as her symptoms continue to worsen.  She does have a history of urinary tract infections but she states never symptoms this bad.  No past history of kidney stones.    Past Medical History:  Diagnosis Date   Constipation    Depression    Dyspnea    with exertion   Gestational diabetes    metformin   Headache    migraine-    Optic disc edema    Pseudotumor cerebri 01/16/2016    Patient Active Problem List   Diagnosis Date Noted   Gestational diabetes mellitus (GDM) controlled on oral hypoglycemic drug, antepartum 12/28/2020   Precipitous delivery, delivered (current hospitalization) 07/29/2019   Gestational diabetes mellitus (GDM) affecting pregnancy 11/11/2017   NSVD (normal spontaneous vaginal delivery) 11/11/2017   Abnormal glucose tolerance test (GTT) during pregnancy, antepartum 09/15/2017   Pseudotumor cerebri 01/16/2016   Transient alteration of awareness 01/16/2016   Syncope and collapse 01/16/2016   Migraine 12/07/2013   Confusion 11/25/2013   Obesity 11/25/2013   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 Authorizing Provider  clonazePAM (KLONOPIN) 1 MG tablet Take 1 mg by mouth at bedtime.   Yes [provider]  ondansetron (ZOFRAN-ODT) 4 MG disintegrating tablet Take 1 tablet (4 mg total) by mouth every 8 (eight) hours as needed for nausea or vomiting. 12/10/22  Yes Particia Nearing, PA-C  sertraline (ZOLOFT) 50 MG tablet Take 75 mg by mouth daily.   Yes [provider]  sulfamethoxazole-trimethoprim (BACTRIM DS) 800-160 MG tablet Take 1 tablet by mouth 2 (two) times daily. 12/10/22  Yes Particia Nearing, PA-C  terconazole (TERAZOL 7) 0.4 % vaginal cream Place 1 applicator vaginally at bedtime. Use for seven days 12/23/20   Raelyn Mora, CNM    Family History Family History  Problem Relation Age of Onset   Hypertension Mother    Diabetes Mother  Aneurysm Father    High blood pressure Other    Breast cancer Maternal Grandmother    Asthma Brother    Spina bifida Brother     Social History Social History   Tobacco Use   Smoking status: Former    Current packs/day: 0.00    Types: Cigarettes    Quit date: 12/2016    Years since quitting: 5.9   Smokeless tobacco: Never   Tobacco comments:    10  Vaping Use   Vaping status: Never Used  Substance Use Topics   Alcohol use: Not Currently    Alcohol/week: 1.0 standard drink of alcohol    Types: 1 Shots of liquor per week    Comment: occasional social- 2 drinks  2 times a month- 02/27/17   Drug use: No     Allergies   Latex   Review of Systems Review of Systems Per HPI  Physical  Exam Triage Vital Signs ED Triage Vitals  Encounter Vitals Group     BP 12/10/22 1754 131/88     Systolic BP Percentile --      Diastolic BP Percentile --      Pulse Rate 12/10/22 1754 99     Resp 12/10/22 1754 20     Temp 12/10/22 1754 100.3 F (37.9 C)     Temp Source 12/10/22 1754 Oral     SpO2 12/10/22 1754 94 %     Weight --      Height --      Head Circumference --      Peak Flow --      Pain Score 12/10/22 1752 8     Pain Loc --      Pain Education --      Exclude from Growth Chart --    No data found.  Updated Vital Signs BP 131/88 (BP Location: Right Arm)   Pulse 99   Temp 100.3 F (37.9 C) (Oral)   Resp 20   LMP 12/10/2022   SpO2 94%   Breastfeeding No   Visual Acuity Right Eye Distance:   Left Eye Distance:   Bilateral Distance:    Right Eye Near:   Left Eye Near:    Bilateral Near:     Physical Exam Vitals and nursing note reviewed.  Constitutional:      Appearance: Normal appearance. She is not ill-appearing.  HENT:     Head: Atraumatic.     Mouth/Throat:     Mouth: Mucous membranes are moist.  Eyes:     Extraocular Movements: Extraocular movements intact.     Conjunctiva/sclera: Conjunctivae normal.  Cardiovascular:     Rate and Rhythm: Normal rate and regular rhythm.     Heart sounds: Normal heart sounds.  Pulmonary:     Effort: Pulmonary effort is normal.     Breath sounds: Normal breath sounds.  Abdominal:     General: Bowel sounds are normal. There is no distension.     Palpations: Abdomen is soft.     Tenderness: There is no abdominal tenderness. There is right CVA tenderness.  Musculoskeletal:        General: Normal range of motion.     Cervical back: Normal range of motion and neck supple.  Skin:    General: Skin is warm and dry.  Neurological:     Mental Status: She is alert and oriented to person, place, and time.  Psychiatric:        Mood and Affect: Mood normal.  Thought Content: Thought content normal.         Judgment: Judgment normal.      UC Treatments / Results  Labs (all labs ordered are listed, but only abnormal results are displayed) Labs Reviewed  POCT URINALYSIS DIP (MANUAL ENTRY) - Abnormal; Notable for the following components:      Result Value   Bilirubin, UA small (*)    Ketones, POC UA small (15) (*)    Blood, UA large (*)    Protein Ur, POC =30 (*)    Urobilinogen, UA 4.0 (*)    Leukocytes, UA Trace (*)    All other components within normal limits  URINE CULTURE    EKG   Radiology No results found.  Procedures Procedures (including critical care time)  Medications Ordered in UC Medications - No data to display  Initial Impression / Assessment and Plan / UC Course  I have reviewed the triage vital signs and the nursing notes.  Pertinent labs & imaging results that were available during my care of the patient were reviewed by me and considered in my medical decision making (see chart for details).     Febrile in triage, otherwise vital signs reassuring.  She does have some right CVA tenderness and trace leuks as well as blood and protein in her urine.  Urine culture pending, concerning for possible urinary tract infection/pyelonephritis given symptoms though no significant leukocytes or nitrites in urine.  Will cover with Bactrim, continue Flomax given this morning at other urgent care and discussed fever control, good hydration and strict return precautions.  Final Clinical Impressions(s) / UC Diagnoses   Final diagnoses:  Right flank pain  Flank pain  Nausea and vomiting, unspecified vomiting type     Discharge Instructions      I have given an antibiotic today to cover for possible urinary tract infection.  You may continue taking the medication for a kidney stone, drinking plenty of fluids, keeping fevers and pain under control with ibuprofen and Tylenol.  Follow-up with your primary care provider for a recheck as soon as possible, go to the emergency  department for further evaluation if severely worsening.  I have sent out a urine culture and we will call you if this tells [place] we need to change anything about your regimen.    ED Prescriptions     Medication Sig Dispense Auth. Provider   sulfamethoxazole-trimethoprim (BACTRIM DS) 800-160 MG tablet Take 1 tablet by mouth 2 (two) times daily. 10 tablet Particia Nearing, PA-C   ondansetron (ZOFRAN-ODT) 4 MG disintegrating tablet Take 1 tablet (4 mg total) by mouth every 8 (eight) hours as needed for nausea or vomiting. 20 tablet Particia Nearing, [place]      PDMP not reviewed this encounter.   Particia Nearing, [place] 12/10/22 1842

## 2022-12-10 NOTE — Discharge Instructions (Signed)
I have given an antibiotic today to cover for possible urinary tract infection.  You may continue taking the medication for a kidney stone, drinking plenty of fluids, keeping fevers and pain under control with ibuprofen and Tylenol.  Follow-up with your primary care provider for a recheck as soon as possible, go to the emergency department for further evaluation if severely worsening.  I have sent out a urine culture and we will call you if this tells [place] we need to change anything about your regimen.

## 2022-12-10 NOTE — ED Triage Notes (Addendum)
Pt reports was seen at [facility] this am and was told had possible kidney stones due to "blood and protein in my urine.". Pt reports right flank pain/lower back pain continues, chills, fever, decreased appetite, fatigue since Sunday.  Pt reports since being seen at [facility], has started period and would like a second opinion.

## 2022-12-12 LAB — URINE CULTURE: Culture: 70000 — AB

## 2023-04-16 ENCOUNTER — Encounter: Payer: Self-pay | Admitting: Advanced Practice Midwife

## 2023-04-16 ENCOUNTER — Ambulatory Visit: Payer: Medicaid Other | Admitting: Advanced Practice Midwife

## 2023-04-16 VITALS — BP 132/87 | HR 83 | Ht 62.0 in | Wt 276.0 lb

## 2023-04-16 DIAGNOSIS — N6310 Unspecified lump in the right breast, unspecified quadrant: Secondary | ICD-10-CM | POA: Insufficient documentation

## 2023-04-16 DIAGNOSIS — N6313 Unspecified lump in the right breast, lower outer quadrant: Secondary | ICD-10-CM

## 2023-04-16 DIAGNOSIS — Z8632 Personal history of gestational diabetes: Secondary | ICD-10-CM | POA: Insufficient documentation

## 2023-04-16 NOTE — Patient Instructions (Signed)
 Call [facility]: [phone].  You have an order in already for a diagnostic mammogram and ultrasound.  Let us know if you have any questions or aren't able to get it scheduled.

## 2023-04-16 NOTE — Progress Notes (Signed)
   GYN VISIT Patient name: Jessica Massey MRN [id number]  Date of birth: Jul 19, 1990 Chief Complaint:   Breast Problem  History of Present Illness:   Jessica Massey is a 33 y.o. [id number] Caucasian female being seen today for concern on R breast; began in the fall 2024 (Oct/Nov) as sharp pain with lifting; in Dec 2024 she felt a tender nodule and has continued to feel it without change in size; no nipple d/c or other changes noticed; strong FH of breast CA (MGM dx in 63s; MA recently dx at age 66 and undergoing radiation)   Patient's last menstrual period was 04/09/2023.   Last pap June 2024. Results were: NILM w/ HRHPV negative     04/16/2023    9:16 AM 08/16/2020    7:23 PM 09/15/2017   10:18 AM  Depression screen PHQ 2/9  Decreased Interest 0 0 0  Down, Depressed, Hopeless 1 0 0  PHQ - 2 Score 1 0 0  Altered sleeping 3    Tired, decreased energy 1    Change in appetite 2    Feeling bad or failure about yourself  2    Trouble concentrating 3    Moving slowly or fidgety/restless 3    Suicidal thoughts 0    PHQ-9 Score 15          04/16/2023    9:17 AM  GAD 7 : Generalized Anxiety Score  Nervous, Anxious, on Edge 2  Control/stop worrying 2  Worry too much - different things 2  Trouble relaxing 3  Restless 3  Easily annoyed or irritable 3  Afraid - awful might happen 3  Total GAD 7 Score 18     Review of Systems:   Pertinent items are noted in HPI Denies fever/chills, dizziness, headaches, visual disturbances, fatigue, shortness of breath, chest pain, abdominal pain, vomiting, abnormal vaginal discharge/itching/odor/irritation, problems with periods, bowel movements, urination, or intercourse unless otherwise stated above.  Pertinent History Reviewed:  Reviewed past medical,surgical, social, obstetrical and family history.  Reviewed problem list, medications and allergies. Physical Assessment:   Vitals:   04/16/23 0846  BP: 132/87  Pulse: 83  Weight: 276 lb  (125.2 kg)  Height: 5\' 2"  (1.575 m)  Body mass index is 50.48 kg/m.       Physical Examination:   General appearance: alert, well appearing, and in no distress  Mental status: alert, oriented to person, place, and time  Breasts: L breast no masses palpated; nipple pierced; R breast with tender area on outer lower quad edge; no specific mass palpated but is point-tender  Skin: warm & dry   Cardiovascular: normal heart rate noted  Respiratory: normal respiratory effort, no distress  Abdomen: soft, non-tender   Pelvic: examination not indicated  Extremities: no edema    No results found for this or any previous visit (from the past 24 hours).  Assessment & Plan:  1) Concern for R breast mass/tenderness in setting of strong FH breast CA> will get diagnostic mammo and R breast u/s; [facility] mamm[facility] number given to pt to schedule  Meds: No orders of the defined types were placed in this encounter.   Orders Placed This Encounter  Procedures   MM 3D DIAGNOSTIC MAMMOGRAM BILATERAL BREAST   US BREAST COMPLETE UNI RIGHT INC AXILLA    Return in about 1 year (around 04/15/2024) for Physical.  Arabella Merles CNM 04/16/2023 11:40 AM

## 2023-04-21 ENCOUNTER — Other Ambulatory Visit (HOSPITAL_COMMUNITY): Payer: Self-pay | Admitting: Advanced Practice Midwife

## 2023-04-21 DIAGNOSIS — N63 Unspecified lump in unspecified breast: Secondary | ICD-10-CM

## 2023-04-23 ENCOUNTER — Other Ambulatory Visit: Payer: Self-pay | Admitting: Advanced Practice Midwife

## 2023-04-23 DIAGNOSIS — N6313 Unspecified lump in the right breast, lower outer quadrant: Secondary | ICD-10-CM

## 2023-06-10 ENCOUNTER — Ambulatory Visit (HOSPITAL_COMMUNITY)
Admission: RE | Admit: 2023-06-10 | Discharge: 2023-06-10 | Disposition: A | Discharge status: Home / Self Care | Payer: Medicaid Other | Source: Ambulatory Visit | Attending: Advanced Practice Midwife | Admitting: Advanced Practice Midwife

## 2023-06-10 ENCOUNTER — Encounter (HOSPITAL_COMMUNITY): Payer: Self-pay

## 2023-06-10 DIAGNOSIS — N6313 Unspecified lump in the right breast, lower outer quadrant: Secondary | ICD-10-CM

## 2023-06-10 DIAGNOSIS — N63 Unspecified lump in unspecified breast: Secondary | ICD-10-CM | POA: Insufficient documentation

## 2023-06-11 ENCOUNTER — Encounter: Payer: Self-pay | Admitting: Advanced Practice Midwife

## 2023-11-25 ENCOUNTER — Ambulatory Visit (INDEPENDENT_AMBULATORY_CARE_PROVIDER_SITE_OTHER)

## 2023-11-25 DIAGNOSIS — F4310 Post-traumatic stress disorder, unspecified: Secondary | ICD-10-CM

## 2023-11-25 DIAGNOSIS — F331 Major depressive disorder, recurrent, moderate: Secondary | ICD-10-CM | POA: Diagnosis not present

## 2023-11-25 DIAGNOSIS — F411 Generalized anxiety disorder: Secondary | ICD-10-CM

## 2023-11-25 DIAGNOSIS — F909 Attention-deficit hyperactivity disorder, unspecified type: Secondary | ICD-10-CM
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
## 2023-11-26 ENCOUNTER — Encounter (HOSPITAL_COMMUNITY): Payer: Self-pay | Admitting: Physician Assistant

## 2023-11-26 ENCOUNTER — Ambulatory Visit: Payer: Self-pay | Admitting: Physician Assistant

## 2023-11-26 ENCOUNTER — Ambulatory Visit (INDEPENDENT_AMBULATORY_CARE_PROVIDER_SITE_OTHER): Admitting: Physician Assistant

## 2023-11-26 ENCOUNTER — Other Ambulatory Visit: Payer: Self-pay

## 2023-11-26 ENCOUNTER — Encounter: Payer: Self-pay | Admitting: Physician Assistant

## 2023-11-26 VITALS — BP 136/85 | HR 91 | Wt 274.6 lb

## 2023-11-26 VITALS — BP 155/92 | HR 88 | Temp 98.2°F | Ht 62.0 in | Wt 275.8 lb

## 2023-11-26 DIAGNOSIS — R030 Elevated blood-pressure reading, without diagnosis of hypertension: Secondary | ICD-10-CM

## 2023-11-26 DIAGNOSIS — Z87891 Personal history of nicotine dependence: Secondary | ICD-10-CM

## 2023-11-26 DIAGNOSIS — R42 Dizziness and giddiness: Secondary | ICD-10-CM

## 2023-11-26 DIAGNOSIS — F331 Major depressive disorder, recurrent, moderate: Secondary | ICD-10-CM

## 2023-11-26 DIAGNOSIS — F909 Attention-deficit hyperactivity disorder, unspecified type: Secondary | ICD-10-CM

## 2023-11-26 DIAGNOSIS — F4310 Post-traumatic stress disorder, unspecified: Secondary | ICD-10-CM | POA: Diagnosis not present

## 2023-11-26 DIAGNOSIS — R631 Polydipsia: Secondary | ICD-10-CM

## 2023-11-26 DIAGNOSIS — F411 Generalized anxiety disorder: Secondary | ICD-10-CM

## 2023-11-26 DIAGNOSIS — F439 Reaction to severe stress, unspecified: Secondary | ICD-10-CM

## 2023-11-26 DIAGNOSIS — F3341 Major depressive disorder, recurrent, in partial remission: Secondary | ICD-10-CM

## 2023-11-26 MED ORDER — FLUOXETINE HCL 20 MG PO CAPS
ORAL_CAPSULE | ORAL | 0 refills | Status: DC
  Filled 2023-11-26: qty 6, 6d supply, fill #0

## 2023-11-26 MED ORDER — FLUOXETINE HCL 40 MG PO CAPS
40.0000 mg | ORAL_CAPSULE | Freq: Every day | ORAL | 1 refills | Status: DC
  Filled 2023-11-26: qty 30, 30d supply, fill #0
  Filled 2024-01-19: qty 30, 30d supply, fill #1

## 2023-11-26 MED ORDER — AMPHETAMINE-DEXTROAMPHETAMINE 10 MG PO TABS
10.0000 mg | ORAL_TABLET | Freq: Every day | ORAL | 0 refills | Status: DC
  Filled 2023-11-26: qty 30, 30d supply, fill #0

## 2023-11-26 MED ORDER — AMPHETAMINE-DEXTROAMPHET ER 20 MG PO CP24
20.0000 mg | ORAL_CAPSULE | Freq: Every morning | ORAL | 0 refills | Status: DC
  Filled 2023-11-26: qty 30, 30d supply, fill #0

## 2023-11-26 NOTE — Patient Instructions (Addendum)
 VISIT SUMMARY:  Today, you were seen for recent episodes of dizziness and headaches, along with elevated blood pressure readings over the past three days. We discussed your current medications, lifestyle factors, and stressors, including your recent separation and responsibilities as a single parent. We also reviewed your history of gestational diabetes and heavy menstrual bleeding.  YOUR PLAN:  -ELEVATED BLOOD PRESSURE READINGS: Your recent elevated blood pressure readings may be influenced by factors such as Adderall, caffeine, stress, and disrupted sleep rather than an underlying condition. We will conduct lab work to check your thyroid function, metabolic panel, anemia, and vitamin D  levels. Please monitor your blood pressure at home, especially before taking Adderall and after reducing caffeine intake. Aim to keep your caffeine intake below 200 mg per day, manage stress, and monitor your sodium intake to 2000-3000 mg per day. If your blood pressure remains high, we may consider reintroducing clonidine.  -DEPRESSION AND ANXIETY: Your depression and anxiety are currently managed with Prozac, and you are receiving ongoing behavioral health support. We will continue with this treatment plan and also check your vitamin D  levels.  How to Take Your Blood Pressure Blood pressure is a measurement of how strongly your blood is pressing against the walls of your arteries. Arteries are blood vessels that carry blood from your heart throughout your body. Your health care provider takes your blood pressure at each office visit. You can also take your own blood pressure at home with a blood pressure monitor. You may need to take your own blood pressure to: Confirm a diagnosis of high blood pressure (hypertension). Monitor your blood pressure over time. Make sure your blood pressure medicine is working. Supplies needed: Blood pressure monitor. A chair to sit in. This should be a chair where you can sit  upright with your back supported. Do not sit on a soft couch or an armchair. Table or desk. Small notebook and pencil or pen. How to prepare To get the most accurate reading, avoid the following for 30 minutes before you check your blood pressure: Drinking caffeine. Drinking alcohol. Eating. Smoking. Exercising. Five minutes before you check your blood pressure: Use the bathroom and urinate so that you have an empty bladder. Sit quietly in a chair. Do not talk. How to take your blood pressure To check your blood pressure, follow the instructions in the manual that came with your blood pressure monitor. If you have a digital blood pressure monitor, the instructions may be as follows: Sit up straight in a chair. Place your feet on the floor. Do not cross your ankles or legs. Rest your left arm at the level of your heart on a table or desk or on the arm of a chair. Pull up your shirt sleeve. Wrap the blood pressure cuff around the upper part of your left arm, 1 inch (2.5 cm) above your elbow. It is best to wrap the cuff around bare skin. Fit the cuff snugly, but not too tightly, around your arm. You should be able to place only one finger between the cuff and your arm. Position the cord so that it rests in the bend of your elbow. Press the power button. Sit quietly while the cuff inflates and deflates. Read the digital reading on the monitor screen and write the numbers down (record them) in a notebook. Wait 2-3 minutes, then repeat the steps, starting at step 1. What does my blood pressure reading mean? A blood pressure reading consists of a higher number over a lower  number. Ideally, your blood pressure should be below 120/80. The first (top) number is called the systolic pressure. It is a measure of the pressure in your arteries as your heart beats. The second (bottom) number is called the diastolic pressure. It is a measure of the pressure in your arteries as the heart relaxes. Blood  pressure is classified into four stages. The following are the stages for adults who do not have a short-term serious illness or a chronic condition. Systolic pressure and diastolic pressure are measured in a unit called mm Hg (millimeters of mercury).  Normal Systolic pressure: below 120. Diastolic pressure: below 80. Elevated Systolic pressure: 120-129. Diastolic pressure: below 80. Hypertension stage 1 Systolic pressure: 130-139. Diastolic pressure: 80-89. Hypertension stage 2 Systolic pressure: 140 or above. Diastolic pressure: 90 or above. You can have elevated blood pressure or hypertension even if only the systolic or only the diastolic number in your reading is higher than normal. Follow these instructions at home: Medicines Take over-the-counter and prescription medicines only as told by your health care provider. Tell your health care provider if you are having any side effects from blood pressure medicine. General instructions Check your blood pressure as often as recommended by your health care provider. Check your blood pressure at the same time every day. Take your monitor to the next appointment with your health care provider to make sure that: You are using it correctly. It provides accurate readings. Understand what your goal blood pressure numbers are. Keep all follow-up visits. This is important. General tips Your health care provider can suggest a reliable monitor that will meet your needs. There are several types of home blood pressure monitors. Choose a monitor that has an arm cuff. Do not choose a monitor that measures your blood pressure from your wrist or finger. Choose a cuff that wraps snugly, not too tight or too loose, around your upper arm. You should be able to fit only one finger between your arm and the cuff. You can buy a blood pressure monitor at most drugstores or online. Where to find more information American Heart Association:  www.heart.org Contact a health care provider if: Your blood pressure is consistently high. Your blood pressure is suddenly low. Get help right away if: Your systolic blood pressure is higher than 180. Your diastolic blood pressure is higher than 120. These symptoms may be an emergency. Get help right away. Call 911. Do not wait to see if the symptoms will go away. Do not drive yourself to the hospital. Summary Blood pressure is a measurement of how strongly your blood is pressing against the walls of your arteries. A blood pressure reading consists of a higher number over a lower number. Ideally, your blood pressure should be below 120/80. Check your blood pressure at the same time every day. Avoid caffeine, alcohol, smoking, and exercise for 30 minutes prior to checking your blood pressure. These agents can affect the accuracy of the blood pressure reading. This information is not intended to replace advice given to you by your health care provider. Make sure you discuss any questions you have with your health care provider. Document Revised: 10/26/2020 Document Reviewed: 10/26/2020 Elsevier Patient Education  2024 [employer].
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
## 2023-11-26 NOTE — Progress Notes (Unsigned)
 New Patient Office Visit  Subjective    Patient ID: Jessica Massey, female    DOB: [birth year]  Age: 33 y.o. MRN: [id number]  CC:  Chief Complaint  Patient presents with   Hypertension   Headache   Discussed the use of AI scribe software for clinical note transcription with the patient, who gave verbal consent to proceed.  History of Present Illness   Jessica Massey is a 33 year old female who presents with recent episodes of dizziness and headaches.   She has elevated blood pressure readings over the past three days, ranging from 136/85 to 146/90, measured at different locations. She experiences dizziness and lightheadedness, especially when bending over and standing up, along with headaches and occasional feelings of being 'flustered'.  Her current medications include Prozac and Adderall, which she takes at night before work and occasionally during lunch. She has discontinued Zoloft and previously used clonidine for anxiety. Her caffeine intake includes Diet Coke and diet teas, and she is attempting to reduce consumption. She drinks plenty of water daily. Her sleep is fragmented due to night shift work, averaging four to seven hours per day, with attempts to catch up on days off.  She is under elevated stress due to a recent separation from her husband, financial and childcare responsibilities, and caring for her son with autism. She does not smoke cigarettes.  She experiences heavy menstrual bleeding attributed to a ParaGard IUD, with her last cycle at the beginning of September. She has a history of gestational diabetes with all pregnancies and is concerned about her A1c levels, previously at 5.5. She notes increased thirst without increased urination.   Outpatient Encounter Medications as of 11/26/2023  Medication Sig   amphetamine-dextroamphetamine (ADDERALL XR) 20 MG 24 hr capsule Take 1 capsule (20 mg total) by mouth every morning.   amphetamine-dextroamphetamine  (ADDERALL) 10 MG tablet Take 1 tablet (10 mg total) by mouth daily with breakfast.   FLUoxetine (PROZAC) 20 MG capsule Patient to take fluoxetine 20 mg daily for 6 days, then continue taking 40 mg daily.   FLUoxetine (PROZAC) 40 MG capsule Take 1 capsule (40 mg total) by mouth daily.   [DISCONTINUED] sertraline (ZOLOFT) 50 MG tablet Take 75 mg by mouth daily.   No facility-administered encounter medications on file as of 11/26/2023.    Past Medical History:  Diagnosis Date   Constipation    Depression    Dyspnea    with exertion   Gestational diabetes    metformin   Headache    migraine-    Optic disc edema    Pseudotumor cerebri 01/16/2016    Past Surgical History:  Procedure Laterality Date   APPENDECTOMY     TOOTH EXTRACTION N/A 02/28/2017   Procedure: DENTAL EXTRACTIONS IMPACTED TEETH NUMBER SIXTEEN, SEVENTEEN, THIRTY-TWO;  Surgeon: Sheryle Hamilton, DDS;  Location: [facility] OR;  Service: Oral Surgery;  Laterality: N/A;    Family History  Problem Relation Age of Onset   Hypertension Mother    Diabetes Mother    Aneurysm Father    Breast cancer Paternal Aunt    Breast cancer Maternal Grandmother    Asthma Brother    Spina bifida Brother    High blood pressure Other     Social History   Socioeconomic History   Marital status: Single    Spouse name: Not on file   Number of children: 2   Years of education: 8th   Highest education level: Not on file  Occupational  History    Employer: [employer]  Tobacco Use   Smoking status: Former    Current packs/day: 0.00    Types: Cigarettes    Quit date: 12/2016    Years since quitting: 6.9   Smokeless tobacco: Never   Tobacco comments:    10  Vaping Use   Vaping status: Never Used  Substance and Sexual Activity   Alcohol use: Not Currently    Alcohol/week: 1.0 standard drink of alcohol    Types: 1 Shots of liquor per week    Comment: occasional social- 2 drinks  2 times a month- 02/27/17   Drug use: No   Sexual  activity: Yes    Birth control/protection: None  Other Topics Concern   Not on file  Social History Narrative   Patient lives at home with her family.   Patient works at [employer].   Patient is right handed.   Caffeine - 32oz per day.   Social Drivers of Corporate investment banker Strain: Low Risk  (04/16/2023)   Overall Financial Resource Strain (CARDIA)    Difficulty of Paying Living Expenses: Not very hard  Food Insecurity: No Food Insecurity (04/16/2023)   Hunger Vital Sign    Worried About Running Out of Food in the Last Year: Never true    Ran Out of Food in the Last Year: Never true  Transportation Needs: No Transportation Needs (04/16/2023)   PRAPARE - Administrator, Civil Service (Medical): No    Lack of Transportation (Non-Medical): No  Physical Activity: Inactive (04/16/2023)   Exercise Vital Sign    Days of Exercise per Week: 2 days    Minutes of Exercise per Session: 0 min  Stress: Stress Concern Present (04/16/2023)   [employer] of Occupational Health - Occupational Stress Questionnaire    Feeling of Stress : Rather much  Social Connections: Socially Isolated (04/16/2023)   Social Connection and Isolation Panel    Frequency of Communication with Friends and Family: Never    Frequency of Social Gatherings with Friends and Family: Never    Attends Religious Services: Never    Database administrator or Organizations: No    Attends Banker Meetings: Never    Marital Status: Married  Catering manager Violence: Not At Risk (04/16/2023)   Humiliation, Afraid, Rape, and Kick questionnaire    Fear of Current or Ex-Partner: No    Emotionally Abused: No    Physically Abused: No    Sexually Abused: No    Review of Systems  Constitutional: Negative.   HENT: Negative.    Eyes: Negative.   Respiratory:  Negative for shortness of breath.   Cardiovascular:  Negative for chest pain.  Gastrointestinal: Negative.   Genitourinary: Negative.    Musculoskeletal: Negative.   Skin: Negative.   Neurological:  Positive for dizziness and headaches.  Endo/Heme/Allergies: Negative.   Psychiatric/Behavioral: Negative.          Objective    BP 136/85 (BP Location: Left Arm, Patient Position: Sitting, Cuff Size: Large)   Pulse 91   Wt 274 lb 9.6 oz (124.6 kg)   SpO2 97%   BMI 50.22 kg/m   Physical Exam Vitals and nursing note reviewed.   GENERAL: Alert, cooperative, well developed, no acute distress HEENT: Normocephalic, normal oropharynx, moist mucous membranes CHEST: Clear to auscultation bilaterally, no wheezes, rhonchi, or crackles CARDIOVASCULAR: Normal heart rate and rhythm, S1 and S2 normal without murmurs EXTREMITIES: No cyanosis or edema NEUROLOGICAL:  Cranial nerves grossly intact, moves all extremities without gross motor or sensory deficit    Assessment & Plan:   Problem List Items Addressed This Visit   None Visit Diagnoses       Elevated blood pressure reading in office without diagnosis of hypertension    -  Primary   Relevant Orders   CBC with Differential/Platelet (Completed)   Comp. Metabolic Panel (12) (Completed)     Lightheadedness       Relevant Orders   TSH (Completed)     Stress         Recurrent major depressive disorder, in partial remission       Relevant Orders   Vitamin D , 25-hydroxy (Completed)     Increased thirst           Assessment and Plan  Elevated blood pressure readings Recent readings suggest external factors like Adderall, caffeine, stress, and sleep disruption rather than intrinsic hypertension. - Order lab work: thyroid function, metabolic panel, anemia screening, vitamin D  level. - Instruct to monitor blood pressure at home, especially before Adderall and after caffeine abstinence. - Advise caffeine intake <200 mg/day. - Encourage stress management and self-care. - Monitor sodium intake 2000-3000 mg/day. - Consider clonidine if blood pressure remains elevated due  to Adderall and caffeine.  Depression and anxiety Managed with Prozac. Stressors include separation and single parenting. Behavioral health support ongoing. - Continue Prozac. - Continue behavioral health support. - Check vitamin D  level.  Gestational diabetes History of gestational diabetes. Previous A1c 5.5 indicates prediabetes. Reports increased thirst. - Order A1c test.  Heavy menstrual bleeding Uses ParaGard causing heavy bleeding. No recent OB/GYN visit. - Encourage OB/GYN follow-up for routine care and bleeding management.   I have reviewed the patient's medical history (PMH, PSH, Social History, Family History, Medications, and allergies) , and have been updated if relevant. I spent 30 minutes reviewing chart and  face to face time with patient.    Return if symptoms worsen or fail to improve.   Kirk RAMAN Mayers, PA-C

## 2023-11-27 ENCOUNTER — Other Ambulatory Visit: Payer: Self-pay

## 2023-11-27 ENCOUNTER — Encounter: Payer: Self-pay | Admitting: Physician Assistant

## 2023-11-27 ENCOUNTER — Ambulatory Visit: Payer: Self-pay | Admitting: Physician Assistant

## 2023-11-27 DIAGNOSIS — R42 Dizziness and giddiness: Secondary | ICD-10-CM

## 2023-11-27 DIAGNOSIS — E559 Vitamin D deficiency, unspecified: Secondary | ICD-10-CM

## 2023-11-27 LAB — COMP. METABOLIC PANEL (12)
AST: 11 IU/L (ref 0–40)
Albumin: 4.1 g/dL (ref 3.9–4.9)
Alkaline Phosphatase: 64 IU/L (ref 41–116)
BUN/Creatinine Ratio: 16 (ref 9–23)
BUN: 13 mg/dL (ref 6–20)
Bilirubin Total: 0.2 mg/dL (ref 0.0–1.2)
Calcium: 8.5 mg/dL — ABNORMAL LOW (ref 8.7–10.2)
Chloride: 104 mmol/L (ref 96–106)
Creatinine, Ser: 0.83 mg/dL (ref 0.57–1.00)
Globulin, Total: 2.7 g/dL (ref 1.5–4.5)
Glucose: 107 mg/dL — ABNORMAL HIGH (ref 70–99)
Potassium: 3.7 mmol/L (ref 3.5–5.2)
Sodium: 138 mmol/L (ref 134–144)
Total Protein: 6.8 g/dL (ref 6.0–8.5)
eGFR: 95 mL/min/1.73 (ref >59)

## 2023-11-27 LAB — CBC WITH DIFFERENTIAL/PLATELET

## 2023-11-27 LAB — VITAMIN D 25 HYDROXY (VIT D DEFICIENCY, FRACTURES): Vit D, 25-Hydroxy: 21.4 ng/mL — ABNORMAL LOW (ref 30.0–100.0)

## 2023-11-27 LAB — TSH: TSH: 1.62 u[IU]/mL (ref 0.450–4.500)

## 2023-12-01 NOTE — Progress Notes (Signed)
 Attempted to contact pt. Number in chart has been disconnected. Sent Mychart message.

## 2023-12-08 NOTE — Progress Notes (Signed)
 Attempted to contact patient, no answer. Patient has viewed results and provider recommendations via Mychart. Will attempt to contact one more time.

## 2023-12-09 ENCOUNTER — Other Ambulatory Visit (HOSPITAL_COMMUNITY)
Admission: RE | Admit: 2023-12-09 | Discharge: 2023-12-09 | Disposition: A | Discharge status: Home / Self Care | Source: Ambulatory Visit | Attending: Physician Assistant | Admitting: Physician Assistant

## 2023-12-09 ENCOUNTER — Encounter: Payer: Self-pay | Admitting: Physician Assistant

## 2023-12-09 ENCOUNTER — Telehealth: Payer: Self-pay | Admitting: Physician Assistant

## 2023-12-09 ENCOUNTER — Other Ambulatory Visit: Payer: Self-pay

## 2023-12-09 VITALS — BP 145/93 | HR 80 | Wt 270.0 lb

## 2023-12-09 DIAGNOSIS — R030 Elevated blood-pressure reading, without diagnosis of hypertension: Secondary | ICD-10-CM

## 2023-12-09 DIAGNOSIS — N898 Other specified noninflammatory disorders of vagina: Secondary | ICD-10-CM | POA: Diagnosis present

## 2023-12-09 DIAGNOSIS — B9689 Other specified bacterial agents as the cause of diseases classified elsewhere: Secondary | ICD-10-CM

## 2023-12-09 DIAGNOSIS — R399 Unspecified symptoms and signs involving the genitourinary system: Secondary | ICD-10-CM

## 2023-12-09 DIAGNOSIS — I10 Essential (primary) hypertension: Secondary | ICD-10-CM

## 2023-12-09 LAB — POCT URINALYSIS DIP (CLINITEK)
Bilirubin, UA: NEGATIVE
Blood, UA: NEGATIVE
Glucose, UA: NEGATIVE mg/dL
Ketones, POC UA: NEGATIVE mg/dL
Nitrite, UA: NEGATIVE
POC PROTEIN,UA: 30 — AB
Spec Grav, UA: 1.02 (ref 1.010–1.025)
Urobilinogen, UA: 1 U/dL
pH, UA: 8.5 — AB (ref 5.0–8.0)

## 2023-12-09 MED ORDER — AMLODIPINE BESYLATE 5 MG PO TABS
5.0000 mg | ORAL_TABLET | Freq: Every day | ORAL | 1 refills | Status: AC
  Filled 2023-12-09: qty 30, 30d supply, fill #0
  Filled 2024-01-19: qty 30, 30d supply, fill #1

## 2023-12-09 MED ORDER — METRONIDAZOLE 500 MG PO TABS
500.0000 mg | ORAL_TABLET | Freq: Two times a day (BID) | ORAL | 0 refills | Status: AC
Start: 2023-12-09 — End: 2023-12-16
  Filled 2023-12-09: qty 14, 7d supply, fill #0

## 2023-12-09 NOTE — Progress Notes (Addendum)
 Established Patient Office Visit  Subjective   Patient ID: Jessica Massey, female    DOB: 04-Jun-1990  Age: 33 y.o. MRN: [id number]  Chief Complaint  Patient presents with   Urinary Tract Infection    Groin and pelvic area pain   Yellow discharge    Discussed the use of AI scribe software for clinical note transcription with the patient, who gave verbal consent to proceed.  Provider evaluated patient remotely from home office, patient was assessed in person by nursing staff on mobile unit. Video visit  History of Present Illness   Jessica Massey is a 33 year old female who presents with groin and pelvic pain and yellowish discharge.  She experiences groin and pelvic pain with yellowish discharge. There is no dysuria. Symptoms began after completing metronidazole  treatment for bacterial vaginitis, which ended last Wednesday or Thursday morning. She also took Diflucan. The discharge was present before treatment, returned after her menstrual period, and the medication course was completed. Initially, the discharge had a bad odor, but currently, there is no odor.  She has a history of a kidney infection and notes that her current symptoms feel similar.  She monitors her blood pressure at home with a wrist cuff. Some readings were elevated, such as 150/95 and 140/95, while others were around 130/90. She takes Adderall and measures her blood pressure in the evening before taking it.      Past Medical History:  Diagnosis Date   Constipation    Depression    Dyspnea    with exertion   Gestational diabetes    metformin   Headache    migraine-    Optic disc edema    Pseudotumor cerebri 01/16/2016   Social History   Socioeconomic History   Marital status: Single    Spouse name: Not on file   Number of children: 2   Years of education: 8th   Highest education level: Not on file  Occupational History    Employer: [employer]  Tobacco Use   Smoking status:  Former    Current packs/day: 0.00    Types: Cigarettes    Quit date: 12/2016    Years since quitting: 6.9   Smokeless tobacco: Never   Tobacco comments:    10  Vaping Use   Vaping status: Never Used  Substance and Sexual Activity   Alcohol use: Not Currently    Alcohol/week: 1.0 standard drink of alcohol    Types: 1 Shots of liquor per week    Comment: occasional social- 2 drinks  2 times a month- 02/27/17   Drug use: No   Sexual activity: Yes    Birth control/protection: None  Other Topics Concern   Not on file  Social History Narrative   Patient lives at home with her family.   Patient works at [employer].   Patient is right handed.   Caffeine - 32oz per day.   Social Drivers of Corporate investment banker Strain: Low Risk  (04/16/2023)   Overall Financial Resource Strain (CARDIA)    Difficulty of Paying Living Expenses: Not very hard  Food Insecurity: No Food Insecurity (04/16/2023)   Hunger Vital Sign    Worried About Running Out of Food in the Last Year: Never true    Ran Out of Food in the Last Year: Never true  Transportation Needs: No Transportation Needs (04/16/2023)   PRAPARE - Transportation    Lack of Transportation (Medical): No    Lack of  Transportation (Non-Medical): No  Physical Activity: Inactive (04/16/2023)   Exercise Vital Sign    Days of Exercise per Week: 2 days    Minutes of Exercise per Session: 0 min  Stress: Stress Concern Present (04/16/2023)   [employer] of Occupational Health - Occupational Stress Questionnaire    Feeling of Stress : Rather much  Social Connections: Socially Isolated (04/16/2023)   Social Connection and Isolation Panel    Frequency of Communication with Friends and Family: Never    Frequency of Social Gatherings with Friends and Family: Never    Attends Religious Services: Never    Database administrator or Organizations: No    Attends Banker Meetings: Never    Marital Status: Married  Catering manager  Violence: Not At Risk (04/16/2023)   Humiliation, Afraid, Rape, and Kick questionnaire    Fear of Current or Ex-Partner: No    Emotionally Abused: No    Physically Abused: No    Sexually Abused: No   Family History  Problem Relation Age of Onset   Hypertension Mother    Diabetes Mother    Aneurysm Father    Breast cancer Paternal Aunt    Breast cancer Maternal Grandmother    Asthma Brother    Spina bifida Brother    High blood pressure Other    Allergies  Allergen Reactions   Latex Anaphylaxis and Hives    Review of Systems  Constitutional:  Negative for chills and fever.  HENT: Negative.    Eyes: Negative.   Respiratory:  Negative for shortness of breath.   Cardiovascular:  Negative for chest pain.  Gastrointestinal:  Positive for abdominal pain. Negative for nausea and vomiting.  Genitourinary:  Negative for dysuria and hematuria.  Musculoskeletal:  Negative for back pain.  Skin: Negative.   Neurological: Negative.   Endo/Heme/Allergies: Negative.   Psychiatric/Behavioral: Negative.        Objective:     BP (!) 145/93 (BP Location: Left Arm, Patient Position: Sitting, Cuff Size: Large)   Pulse 80   Wt 270 lb (122.5 kg)   SpO2 99%   BMI 49.38 kg/m  BP Readings from Last 3 Encounters:  12/09/23 (!) 145/93  11/26/23 136/85  04/16/23 132/87   Wt Readings from Last 3 Encounters:  12/09/23 270 lb (122.5 kg)  11/26/23 274 lb 9.6 oz (124.6 kg)  04/16/23 276 lb (125.2 kg)    Physical Exam No physical was completed due to type of visit   Assessment & Plan:   Problem List Items Addressed This Visit   None Visit Diagnoses       UTI symptoms    -  Primary   Relevant Orders   POCT URINALYSIS DIP (CLINITEK) (Completed)     Essential hypertension       Relevant Medications   amLODipine (NORVASC) 5 MG tablet   Other Relevant Orders   CBC with Differential/Platelet     Bacterial vaginitis       Relevant Medications   metroNIDAZOLE  (FLAGYL ) 500 MG tablet      Vaginal discharge       Relevant Orders   Cervicovaginal ancillary only       Results LABS Urinalysis: Few bacteria, proteinuria, altered pH, no nitrites (12/09/2023)  Assessment and Plan Bacterial vaginosis with recurrent symptoms and yellow vaginal discharge Recurrent bacterial vaginosis with yellow discharge, no odor, and pelvic pain. Urinalysis suggests no UTI. Differential includes trichomonas.  - Prescribed metronidazole , sent prescription to [facility]. -  Send urine for culture. Vaginal swab completed, patient declines HIV or syphilis testing   Elevated blood pressure Intermittent elevated blood pressure, up to 150/95 mmHg, measured before Adderall. Decision to start low-dose antihypertensive medication. - Prescribe amlodipine 5 mg daily. - Monitor blood pressure at home. - Schedule follow-up appointment in a few weeks to reassess blood pressure.  General Health Maintenance Discussed vitamin D  supplementation, encouraged 2000 IU daily intake. - Purchase and take vitamin D  supplement, ensuring 2000 IU daily intake.  Red flags given for prompt reevaluation   I have reviewed the patient's medical history (PMH, PSH, Social History, Family History, Medications, and allergies) , and have been updated if relevant. I spent 30 minutes reviewing chart and  face to face time with patient.     Return in about 2 weeks (around 12/23/2023) for With [facility].    Kirk RAMAN Mayers, PA-C

## 2023-12-09 NOTE — Patient Instructions (Signed)
 VISIT SUMMARY:  During your visit, we discussed your groin and pelvic pain with yellowish discharge, which began after completing treatment for bacterial vaginosis. We also reviewed your blood pressure readings and discussed general health maintenance.  YOUR PLAN:  -BACTERIAL VAGINOSIS WITH RECURRENT SYMPTOMS AND YELLOW VAGINAL DISCHARGE: Bacterial vaginosis is an infection caused by an imbalance of bacteria in the vagina. You have recurrent symptoms with yellow discharge and pelvic pain. We have prescribed metronidazole  to treat the infection and sent your urine for culture to rule out other infections.  -PELVIC AND GROIN PAIN: Your pelvic and groin pain is associated with recurrent bacterial vaginosis. Treating the underlying bacterial vaginosis should help alleviate the pain.  -ELEVATED BLOOD PRESSURE: You have intermittent elevated blood pressure readings, with some as high as 150/95 mmHg. We have decided to start you on a low-dose antihypertensive medication, amlodipine 5 mg daily. Please monitor your blood pressure at home and we will reassess it during your follow-up appointment in a few weeks.  -GENERAL HEALTH MAINTENANCE: We discussed the importance of vitamin D  supplementation. Please purchase a vitamin D  supplement and take 2000 IU daily.

## 2023-12-10 ENCOUNTER — Ambulatory Visit: Payer: Self-pay | Admitting: Physician Assistant

## 2023-12-10 ENCOUNTER — Other Ambulatory Visit: Payer: Self-pay

## 2023-12-10 ENCOUNTER — Encounter: Payer: Self-pay | Admitting: Physician Assistant

## 2023-12-10 DIAGNOSIS — D649 Anemia, unspecified: Secondary | ICD-10-CM

## 2023-12-10 DIAGNOSIS — A749 Chlamydial infection, unspecified: Secondary | ICD-10-CM

## 2023-12-10 LAB — CERVICOVAGINAL ANCILLARY ONLY
Bacterial Vaginitis (gardnerella): POSITIVE — AB
Candida Glabrata: NEGATIVE
Candida Vaginitis: NEGATIVE
Chlamydia: POSITIVE — AB
Comment: NEGATIVE
Comment: NEGATIVE
Comment: NEGATIVE
Comment: NEGATIVE
Comment: NEGATIVE
Comment: NORMAL
Neisseria Gonorrhea: NEGATIVE
Trichomonas: NEGATIVE

## 2023-12-10 LAB — CBC WITH DIFFERENTIAL/PLATELET
Basophils Absolute: 0 x10E3/uL (ref 0.0–0.2)
Basos: 0 %
EOS (ABSOLUTE): 0.1 x10E3/uL (ref 0.0–0.4)
Eos: 1 %
Hematocrit: 36.5 % (ref 34.0–46.6)
Hemoglobin: 10.5 g/dL — ABNORMAL LOW (ref 11.1–15.9)
Immature Grans (Abs): 0 x10E3/uL (ref 0.0–0.1)
Immature Granulocytes: 0 %
Lymphocytes Absolute: 1.4 x10E3/uL (ref 0.7–3.1)
Lymphs: 15 %
MCH: 23.4 pg — ABNORMAL LOW (ref 26.6–33.0)
MCHC: 28.8 g/dL — ABNORMAL LOW (ref 31.5–35.7)
MCV: 81 fL (ref 79–97)
Monocytes Absolute: 0.7 x10E3/uL (ref 0.1–0.9)
Monocytes: 7 %
Neutrophils Absolute: 7 x10E3/uL (ref 1.4–7.0)
Neutrophils: 77 %
Platelets: 249 x10E3/uL (ref 150–450)
RBC: 4.49 x10E6/uL (ref 3.77–5.28)
RDW: 14.4 % (ref 11.7–15.4)
WBC: 9.3 x10E3/uL (ref 3.4–10.8)

## 2023-12-10 MED ORDER — DOXYCYCLINE HYCLATE 100 MG PO CAPS
100.0000 mg | ORAL_CAPSULE | Freq: Two times a day (BID) | ORAL | 0 refills | Status: AC
  Filled 2023-12-10: qty 14, 7d supply, fill #0

## 2023-12-11 ENCOUNTER — Other Ambulatory Visit: Payer: Self-pay

## 2023-12-11 NOTE — Progress Notes (Signed)
 Attempted to contact pt no answer, no VM. Patient has viewed lab results and provider recommendations on Mychart

## 2023-12-12 ENCOUNTER — Other Ambulatory Visit: Payer: Self-pay

## 2023-12-12 ENCOUNTER — Other Ambulatory Visit (HOSPITAL_COMMUNITY): Payer: Self-pay

## 2023-12-12 MED ORDER — DOXYCYCLINE HYCLATE 100 MG PO TABS
100.0000 mg | ORAL_TABLET | Freq: Two times a day (BID) | ORAL | 0 refills | Status: AC
  Filled 2023-12-12: qty 14, 7d supply, fill #0

## 2023-12-22 ENCOUNTER — Telehealth: Payer: Self-pay | Admitting: Physician Assistant

## 2023-12-22 NOTE — Telephone Encounter (Signed)
 Contacted pt to schedule follow up visit with [facility]. No answer/unable to leave vm

## 2023-12-24 ENCOUNTER — Other Ambulatory Visit: Payer: Self-pay

## 2023-12-24 ENCOUNTER — Other Ambulatory Visit: Payer: Self-pay | Admitting: Physician Assistant

## 2023-12-24 DIAGNOSIS — B379 Candidiasis, unspecified: Secondary | ICD-10-CM

## 2023-12-24 MED ORDER — FLUCONAZOLE 150 MG PO TABS
150.0000 mg | ORAL_TABLET | Freq: Every day | ORAL | 0 refills | Status: DC
  Filled 2023-12-24: qty 1, 1d supply, fill #0

## 2024-01-01 ENCOUNTER — Encounter: Payer: Self-pay | Admitting: Nurse Practitioner

## 2024-01-01 ENCOUNTER — Other Ambulatory Visit: Payer: Self-pay

## 2024-01-01 ENCOUNTER — Ambulatory Visit (INDEPENDENT_AMBULATORY_CARE_PROVIDER_SITE_OTHER): Payer: Self-pay | Admitting: Nurse Practitioner

## 2024-01-01 ENCOUNTER — Other Ambulatory Visit (HOSPITAL_COMMUNITY): Payer: Self-pay

## 2024-01-01 VITALS — BP 124/75 | HR 98 | Wt 280.0 lb

## 2024-01-01 DIAGNOSIS — Z113 Encounter for screening for infections with a predominantly sexual mode of transmission: Secondary | ICD-10-CM

## 2024-01-01 DIAGNOSIS — D509 Iron deficiency anemia, unspecified: Secondary | ICD-10-CM

## 2024-01-01 DIAGNOSIS — Z7689 Persons encountering health services in other specified circumstances: Secondary | ICD-10-CM

## 2024-01-01 DIAGNOSIS — Z23 Encounter for immunization: Secondary | ICD-10-CM

## 2024-01-01 DIAGNOSIS — E559 Vitamin D deficiency, unspecified: Secondary | ICD-10-CM

## 2024-01-01 MED ORDER — IRON 325 (65 FE) MG PO TABS
1.0000 | ORAL_TABLET | Freq: Every day | ORAL | 0 refills | Status: AC
  Filled 2024-01-01: qty 30, 30d supply, fill #0

## 2024-01-01 MED ORDER — FLUCONAZOLE 150 MG PO TABS
150.0000 mg | ORAL_TABLET | Freq: Once | ORAL | 0 refills | Status: AC
  Filled 2024-01-01: qty 1, 1d supply, fill #0

## 2024-01-01 MED ORDER — VITAMIN D (ERGOCALCIFEROL) 1.25 MG (50000 UNIT) PO CAPS
50000.0000 [IU] | ORAL_CAPSULE | ORAL | 2 refills | Status: AC
  Filled 2024-01-01: qty 5, 35d supply, fill #0

## 2024-01-01 NOTE — Progress Notes (Signed)
 Subjective   Patient ID: Jessica Massey, female    DOB: May 24, 1990, 33 y.o.   MRN: [id number]  Chief Complaint  Patient presents with   Establish Care    Referring provider: Ilah Crigler, MD  Jessica Massey is a 33 y.o. 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  She does need follow-up STD testing.  She has recently completed flagyl  for BV and doxycycline for chlamydia.  She states that her partner has been treated.  She does feel like she has yeast.  We will order Diflucan.  Patient was seen in the mobile unit recently did have vitamin D  deficiency and anemia.  We will start her on iron and vitamin D  supplement.  We will recheck hemoglobin and iron levels today. Denies f/c/s, n/v/d, hemoptysis, PND, leg swelling. Denies chest pain or edema.    Allergies  Allergen Reactions   Latex Anaphylaxis and Hives    Immunization History  Administered Date(s) Administered   DTP 07/06/1990, 09/07/1990, 11/06/1990, 08/06/1991   DTaP 05/02/1995   HIB, Unspecified 07/06/1990, 09/07/1990, 11/06/1990, 08/06/1991   HPV Quadrivalent 08/18/2007, 12/19/2008   Hep B, Unspecified 05/02/1995, 06/02/1995, 10/03/1995   Hepatitis A, Ped/Adol-2 Dose 08/18/2007, 12/19/2008   Influenza, Seasonal, Injecte, Preservative Fre 12/19/2008, 12/23/2022, 01/01/2024   Influenza,inj,Quad PF,6+ Mos 10/21/2016, 12/21/2018   MMR 08/06/1991, 05/02/1995, 11/13/2017   Meningococcal Conjugate 08/18/2007   OPV 07/06/1990, 09/07/1990, 08/06/1991, 05/02/1995   PPD Test 09/05/2022   Tdap 08/18/2007, 08/26/2017, 05/11/2019, 10/04/2020, 12/23/2022    Tobacco History: Social History   Tobacco Use  Smoking Status Former   Current  packs/day: 0.00   Types: Cigarettes   Quit date: 12/2016   Years since quitting: 7.0  Smokeless Tobacco Never  Tobacco Comments   10   Counseling given: Not Answered Tobacco comments: 10   Outpatient Encounter Medications as of 01/01/2024  Medication Sig   amLODipine (NORVASC) 5 MG tablet Take 1 tablet (5 mg total) by mouth daily.   amphetamine-dextroamphetamine (ADDERALL XR) 20 MG 24 hr capsule Take 1 capsule (20 mg total) by mouth every morning.   amphetamine-dextroamphetamine (ADDERALL) 10 MG tablet Take 1 tablet (10 mg total) by mouth daily with breakfast.   Ferrous Sulfate (IRON) 325 (65 Fe) MG TABS Take 1 tablet (325 mg total) by mouth daily.   fluconazole (DIFLUCAN) 150 MG tablet Take 1 tablet (150 mg total) by mouth once for 1 dose.   FLUoxetine (PROZAC) 40 MG capsule Take 1 capsule (40 mg total) by mouth daily.   Vitamin D , Ergocalciferol , (DRISDOL) 1.25 MG (50000 UNIT) CAPS capsule Take 1 capsule (50,000 Units total) by mouth every 7 (seven) days.   doxycycline (VIBRA-TABS) 100 MG tablet Take 1 tablet by mouth twice daily for 7 days. (Patient not taking: Reported on 01/01/2024)   fluconazole (DIFLUCAN) 150 MG tablet Take 1 tablet (150 mg total) by mouth daily. (Patient not taking: Reported on 01/01/2024)   FLUoxetine (PROZAC) 20 MG capsule Patient to take fluoxetine 20 mg daily for 6 days, then continue taking 40 mg daily. (Patient not taking: Reported on 01/01/2024)   No facility-administered encounter medications on file as of 01/01/2024.    Review of Systems  Review of Systems  Constitutional: Negative.   HENT: Negative.    Cardiovascular: Negative.   Gastrointestinal: Negative.   Allergic/Immunologic: Negative.   Neurological: Negative.   Psychiatric/Behavioral: Negative.       Objective:   BP 124/75   Pulse 98   Wt 280 lb (127 kg)   SpO2 99%   BMI 51.21 kg/m   Wt Readings from Last 5 Encounters:  01/01/24 280 lb (127 kg)  12/09/23 270 lb (122.5 kg)   11/26/23 274 lb 9.6 oz (124.6 kg)  04/16/23 276 lb (125.2 kg)  12/28/20 (!) 327 lb 12.8 oz (148.7 kg)     Physical Exam Vitals and nursing note reviewed.  Constitutional:      General: She is not in acute distress.    Appearance: She is well-developed.  Cardiovascular:     Rate and Rhythm: Normal rate and regular rhythm.  Pulmonary:     Effort: Pulmonary effort is normal.     Breath sounds: Normal breath sounds.  Neurological:     Mental Status: She is alert and oriented to person, place, and time.       Assessment & Plan:   Encounter to establish care with new provider  Need for influenza vaccination -     Flu vaccine trivalent PF, 6mos and older(Flulaval,Afluria,Fluarix,Fluzone)  Screen for STD (sexually transmitted disease) -     Chlamydia/GC NAA, Confirmation -     NuSwab Vaginitis Plus (VG+)  Vitamin D  deficiency -     Vitamin D  (Ergocalciferol ); Take 1 capsule (50,000 Units total) by mouth every 7 (seven) days.  Dispense: 5 capsule; Refill: 2  Iron deficiency anemia, unspecified iron deficiency anemia type -     Iron; Take 1 tablet (325 mg total) by mouth daily.  Dispense: 30 tablet; Refill: 0 -     Iron, TIBC and Ferritin Panel -     CBC -     Comprehensive metabolic panel with GFR  Other orders -     Fluconazole; Take 1 tablet (150 mg total) by mouth once for 1 dose.  Dispense: 1 tablet; Refill: 0     Return in about 3 months (around 04/02/2024) for anemia and vitamin d  def.   Bascom GORMAN Borer, NP 01/01/2024

## 2024-01-02 ENCOUNTER — Ambulatory Visit: Payer: Self-pay | Admitting: Nurse Practitioner

## 2024-01-02 ENCOUNTER — Other Ambulatory Visit: Payer: Self-pay

## 2024-01-02 DIAGNOSIS — B379 Candidiasis, unspecified: Secondary | ICD-10-CM

## 2024-01-02 LAB — COMPREHENSIVE METABOLIC PANEL WITH GFR
ALT: 15 IU/L (ref 0–32)
AST: 21 IU/L (ref 0–40)
Albumin: 4.1 g/dL (ref 3.9–4.9)
Alkaline Phosphatase: 57 IU/L (ref 41–116)
BUN/Creatinine Ratio: 17 (ref 9–23)
BUN: 13 mg/dL (ref 6–20)
Bilirubin Total: 0.4 mg/dL (ref 0.0–1.2)
CO2: 25 mmol/L (ref 20–29)
Calcium: 8.7 mg/dL (ref 8.7–10.2)
Chloride: 101 mmol/L (ref 96–106)
Creatinine, Ser: 0.78 mg/dL (ref 0.57–1.00)
Globulin, Total: 2.8 g/dL (ref 1.5–4.5)
Glucose: 83 mg/dL (ref 70–99)
Potassium: 3.8 mmol/L (ref 3.5–5.2)
Sodium: 140 mmol/L (ref 134–144)
Total Protein: 6.9 g/dL (ref 6.0–8.5)
eGFR: 103 mL/min/1.73 (ref >59)

## 2024-01-02 LAB — CBC
Hematocrit: 33.2 % — ABNORMAL LOW (ref 34.0–46.6)
Hemoglobin: 9.9 g/dL — ABNORMAL LOW (ref 11.1–15.9)
MCH: 23.3 pg — ABNORMAL LOW (ref 26.6–33.0)
MCHC: 29.8 g/dL — ABNORMAL LOW (ref 31.5–35.7)
MCV: 78 fL — ABNORMAL LOW (ref 79–97)
Platelets: 276 x10E3/uL (ref 150–450)
RBC: 4.25 x10E6/uL (ref 3.77–5.28)
RDW: 14.5 % (ref 11.7–15.4)
WBC: 8.9 x10E3/uL (ref 3.4–10.8)

## 2024-01-02 LAB — IRON,TIBC AND FERRITIN PANEL
Ferritin: 9 ng/mL — ABNORMAL LOW (ref 15–150)
Iron Saturation: 7 % — CL (ref 15–55)
Iron: 24 ug/dL — ABNORMAL LOW (ref 27–159)
Total Iron Binding Capacity: 366 ug/dL (ref 250–450)
UIBC: 342 ug/dL (ref 131–425)

## 2024-01-04 LAB — NUSWAB VAGINITIS PLUS (VG+)
Candida albicans, NAA: POSITIVE — AB
Chlamydia trachomatis, NAA: NEGATIVE
Neisseria gonorrhoeae, NAA: NEGATIVE
Trich vag by NAA: NEGATIVE

## 2024-01-04 LAB — CHLAMYDIA/GC NAA, CONFIRMATION
Chlamydia trachomatis, NAA: NEGATIVE
Neisseria gonorrhoeae, NAA: NEGATIVE

## 2024-01-05 ENCOUNTER — Other Ambulatory Visit: Payer: Self-pay

## 2024-01-05 MED ORDER — FLUCONAZOLE 150 MG PO TABS
150.0000 mg | ORAL_TABLET | Freq: Every day | ORAL | 0 refills | Status: AC
  Filled 2024-01-05: qty 1, 3d supply, fill #0

## 2024-01-09 ENCOUNTER — Encounter (HOSPITAL_COMMUNITY): Payer: Self-pay

## 2024-01-14 ENCOUNTER — Other Ambulatory Visit: Payer: Self-pay

## 2024-01-19 ENCOUNTER — Other Ambulatory Visit (HOSPITAL_COMMUNITY): Payer: Self-pay | Admitting: Physician Assistant

## 2024-01-19 ENCOUNTER — Other Ambulatory Visit (HOSPITAL_COMMUNITY): Payer: Self-pay

## 2024-01-19 ENCOUNTER — Other Ambulatory Visit: Payer: Self-pay

## 2024-01-19 DIAGNOSIS — F909 Attention-deficit hyperactivity disorder, unspecified type: Secondary | ICD-10-CM

## 2024-01-20 ENCOUNTER — Other Ambulatory Visit (HOSPITAL_COMMUNITY): Payer: Self-pay

## 2024-01-20 ENCOUNTER — Telehealth (HOSPITAL_COMMUNITY): Payer: Self-pay

## 2024-01-20 ENCOUNTER — Other Ambulatory Visit (HOSPITAL_COMMUNITY): Payer: Self-pay | Admitting: Physician Assistant

## 2024-01-20 DIAGNOSIS — F909 Attention-deficit hyperactivity disorder, unspecified type: Secondary | ICD-10-CM

## 2024-01-20 NOTE — Telephone Encounter (Signed)
 Patient called she needs an order for drug screen, in order to go to [facility].

## 2024-01-28 ENCOUNTER — Telehealth (HOSPITAL_COMMUNITY): Payer: Self-pay | Admitting: Physician Assistant

## 2024-01-28 ENCOUNTER — Other Ambulatory Visit: Payer: Self-pay

## 2024-01-28 NOTE — Telephone Encounter (Signed)
 Patient would like a refill of Adderall XR and Adderall 10 MG sent to [facility]. Please advise. Thank you.

## 2024-01-30 ENCOUNTER — Encounter (HOSPITAL_COMMUNITY): Admitting: Psychiatry

## 2024-01-31 LAB — URINE DRUG PANEL 7
Barbiturate Quant, Ur: NEGATIVE ng/mL
Benzodiazepine Quant, Ur: NEGATIVE ng/mL
Cannabinoid Quant, Ur: NEGATIVE ng/mL
Cocaine (Metab.): NEGATIVE ng/mL
Creatinine, Urine: 104.9 mg/dL (ref 20.0–300.0)
Nitrite Urine, Quantitative: NEGATIVE ug/mL
OPIATE SCREEN URINE: NEGATIVE ng/mL
PCP Quant, Ur: NEGATIVE ng/mL
pH, Urine: 5.6 (ref 4.5–8.9)

## 2024-01-31 LAB — AMPHETAMINE CONF, UR
Amphetamine GC/MS Conf: >3000 ng/mL
Amphetamine: POSITIVE — AB
Amphetamines: POSITIVE — AB
Methamphetamine: NEGATIVE

## 2024-02-01 MED ORDER — AMPHETAMINE-DEXTROAMPHETAMINE 10 MG PO TABS
10.0000 mg | ORAL_TABLET | Freq: Every day | ORAL | 0 refills | Status: DC
  Filled 2024-02-01: qty 30, 30d supply, fill #0

## 2024-02-01 MED ORDER — AMPHETAMINE-DEXTROAMPHET ER 20 MG PO CP24
20.0000 mg | ORAL_CAPSULE | Freq: Every morning | ORAL | 0 refills | Status: DC
  Filled 2024-02-01 – 2024-02-24 (×2): qty 30, 30d supply, fill #0

## 2024-02-01 NOTE — Telephone Encounter (Signed)
Patients medication has been refilled.  °

## 2024-02-01 NOTE — Telephone Encounter (Signed)
 Message acknowledged and reviewed.

## 2024-02-02 ENCOUNTER — Encounter (HOSPITAL_COMMUNITY): Payer: Self-pay

## 2024-02-02 ENCOUNTER — Other Ambulatory Visit: Payer: Self-pay

## 2024-02-02 ENCOUNTER — Other Ambulatory Visit (HOSPITAL_COMMUNITY): Payer: Self-pay

## 2024-02-03 ENCOUNTER — Other Ambulatory Visit: Payer: Self-pay

## 2024-02-03 ENCOUNTER — Telehealth (INDEPENDENT_AMBULATORY_CARE_PROVIDER_SITE_OTHER): Admitting: Physician Assistant

## 2024-02-03 ENCOUNTER — Encounter (HOSPITAL_COMMUNITY): Payer: Self-pay | Admitting: Physician Assistant

## 2024-02-03 DIAGNOSIS — F4310 Post-traumatic stress disorder, unspecified: Secondary | ICD-10-CM

## 2024-02-03 DIAGNOSIS — F909 Attention-deficit hyperactivity disorder, unspecified type: Secondary | ICD-10-CM

## 2024-02-03 DIAGNOSIS — F331 Major depressive disorder, recurrent, moderate: Secondary | ICD-10-CM

## 2024-02-03 DIAGNOSIS — F411 Generalized anxiety disorder: Secondary | ICD-10-CM

## 2024-02-03 MED ORDER — FLUOXETINE HCL 10 MG PO CAPS
10.0000 mg | ORAL_CAPSULE | Freq: Every day | ORAL | 0 refills | Status: DC
  Filled 2024-02-03: qty 7, 7d supply, fill #0

## 2024-02-03 MED ORDER — ESCITALOPRAM OXALATE 5 MG PO TABS
5.0000 mg | ORAL_TABLET | ORAL | 0 refills | Status: AC
  Filled 2024-02-03 – 2024-02-24 (×2): qty 6, 6d supply, fill #0

## 2024-02-03 MED ORDER — ESCITALOPRAM OXALATE 10 MG PO TABS
10.0000 mg | ORAL_TABLET | Freq: Every day | ORAL | 1 refills | Status: DC
  Filled 2024-02-03 – 2024-02-24 (×2): qty 30, 30d supply, fill #0

## 2024-02-03 NOTE — Progress Notes (Signed)
 [facility] MD/PA/NP OP Progress Note  Virtual Visit via Video Note  I connected with Jessica Massey on 02/03/24 at  4:00 PM EST by a video enabled telemedicine application and verified that I am speaking with the correct person using two identifiers.  Location: Patient: Home Provider: Clinic   I discussed the limitations of evaluation and management by telemedicine and the availability of in person appointments. The patient expressed understanding and agreed to proceed.  Follow Up Instructions:  I discussed the assessment and treatment plan with the patient. The patient was provided an opportunity to ask questions and all were answered. The patient agreed with the plan and demonstrated an understanding of the instructions.   The patient was advised to call back or seek an in-person evaluation if the symptoms worsen or if the condition fails to improve as anticipated.  I provided 22 minutes of non-face-to-face time during this encounter.  Reginia FORBES Bolster, PA    02/03/2024 7:00 PM Jessica Massey  MRN:  [id number]  Chief Complaint:  Chief Complaint  Patient presents with   Follow-up   Medication Management   HPI:   Jessica Massey is a 33 year old female with a past psychiatric history significant for major depressive disorder (recurrent episode, moderate), attention deficit hyperactivity disorder (unspecified ADHD type), generalized anxiety disorder, and PTSD who presents to [facility] for follow-up and medication management.  Patient is currently being managed on the following psychiatric medications:  Adderall XR 20 mg daily Adderall 10 mg daily Fluoxetine  40 mg daily  Patient reports that her use of Adderall is going well.  Since taking fluoxetine , patient reports that she feels on edge and snappy.  She also notes that she is going through a lot as well.  Regards to her stressors, patient reports that her mother-in-law recently passed  away and her children's father is taking all of her kids except for 1 due to 1 child requiring special needs.  Patient also reports that she is having to move out of her apartment and moved back in with her mother.  Patient endorses depression and rates her depression a 6-1/2 to 7 out of 10 with 10 being more severe.  Patient endorses depressive episodes 4 days/week.  Patient endorses the following depressive symptoms: feelings of sadness, crying spells, lack of motivation, decreased concentration, decreased energy, irritability, feelings of guilt/worthlessness, and hopelessness.  Patient also endorses anxiety and rates her anxiety as 5 out of 10.  She reports that she tends to feel anxious and on edge a lot due to trying to make sure no one is out to get her.  She also reports that she feels inadequate as her mother due to not having a reliable place for her children and her to live.  A PHQ-9 screen was performed with the patient scoring a 24.  A GAD-7 screen was also performed the patient scoring a 20.  Patient is alert and oriented x 4, calm, cooperative, and fully engaged in conversation during the encounter.  Patient endorses irritated mood and states that she is often unable to go to sleep due to her mood.  Patient exhibits depressed mood with congruent affect.  Patient denies suicidal ideations but states that she has moments where she wishes she were dead.  Patient denies having a specific plan to harm herself.  Patient denies homicidal ideations.  She further denies auditory or visual hallucinations and does not appear to be responding to internal/external stimuli.  Patient endorses  fair sleep and receives on average 5 to 6 hours of sleep per night.  Patient endorses fair appetite and eats on average 1-2 meals per day.  Patient endorses alcohol consumption on occasion.  Patient denies tobacco use but does engage in vaping.  Patient denies illicit drug use.  Visit Diagnosis:    ICD-10-CM   1.  Attention deficit hyperactivity disorder (ADHD), unspecified ADHD type  F90.9     2. Major depressive disorder, recurrent episode, moderate (HCC)  F33.1 FLUoxetine  (PROZAC ) 10 MG capsule    escitalopram  (LEXAPRO ) 5 MG tablet    escitalopram  (LEXAPRO ) 10 MG tablet    3. Generalized anxiety disorder  F41.1 FLUoxetine  (PROZAC ) 10 MG capsule    escitalopram  (LEXAPRO ) 5 MG tablet    escitalopram  (LEXAPRO ) 10 MG tablet    4. PTSD (post-traumatic stress disorder)  F43.10 FLUoxetine  (PROZAC ) 10 MG capsule    escitalopram  (LEXAPRO ) 5 MG tablet    escitalopram  (LEXAPRO ) 10 MG tablet      Past Psychiatric History:  Patient has a past psychiatric history significant for ADHD, generalized anxiety disorder, PTSD, and major depressive disorder.   Patient denies a past history of hospitalization due to mental health.   Patient endorses a past history of suicide attempt stating that she tried to hang herself 8 to 9 years ago.   Patient denies a past history of homicide attempts.  Past Medical History:  Past Medical History:  Diagnosis Date   Constipation    Depression    Dyspnea    with exertion   Gestational diabetes    metformin   Headache    migraine-    Optic disc edema    Pseudotumor cerebri 01/16/2016    Past Surgical History:  Procedure Laterality Date   APPENDECTOMY     TOOTH EXTRACTION N/A 02/28/2017   Procedure: DENTAL EXTRACTIONS IMPACTED TEETH NUMBER SIXTEEN, SEVENTEEN, THIRTY-TWO;  Surgeon: Sheryle Hamilton, DDS;  Location: [facility] OR;  Service: Oral Surgery;  Laterality: N/A;    Family Psychiatric History:  Mother - depression, patient reports that her mother is currently on Prozac .  She reports that she has obtained a few Prozac  pills from her mother when she has run out of her prescription.   Family history of suicide attempt: Patient denies Family history of homicide attempt: Patient denies Family history of substance abuse: Patient reports that her brother abuses alcohol,  marijuana, and cocaine  Family History:  Family History  Problem Relation Age of Onset   Hypertension Mother    Diabetes Mother    Aneurysm Father    Breast cancer Paternal Aunt    Breast cancer Maternal Grandmother    Asthma Brother    Spina bifida Brother    High blood pressure Other     Social History:  Social History   Socioeconomic History   Marital status: Single    Spouse name: Not on file   Number of children: 2   Years of education: 8th   Highest education level: Not on file  Occupational History    Employer: [employer]  Tobacco Use   Smoking status: Former    Current packs/day: 0.00    Types: Cigarettes    Quit date: 12/2016    Years since quitting: 7.1   Smokeless tobacco: Never   Tobacco comments:    10  Vaping Use   Vaping status: Never Used  Substance and Sexual Activity   Alcohol use: Not Currently    Alcohol/week: 1.0 standard drink of alcohol  Types: 1 Shots of liquor per week    Comment: occasional social- 2 drinks  2 times a month- 02/27/17   Drug use: No   Sexual activity: Yes    Birth control/protection: None  Other Topics Concern   Not on file  Social History Narrative   Patient lives at home with her family.   Patient works at [employer].   Patient is right handed.   Caffeine - 32oz per day.   Social Drivers of Corporate Investment Banker Strain: Low Risk  (04/16/2023)   Overall Financial Resource Strain (CARDIA)    Difficulty of Paying Living Expenses: Not very hard  Food Insecurity: No Food Insecurity (04/16/2023)   Hunger Vital Sign    Worried About Running Out of Food in the Last Year: Never true    Ran Out of Food in the Last Year: Never true  Transportation Needs: No Transportation Needs (04/16/2023)   PRAPARE - Administrator, Civil Service (Medical): No    Lack of Transportation (Non-Medical): No  Physical Activity: Inactive (04/16/2023)   Exercise Vital Sign    Days of Exercise per Week: 2 days     Minutes of Exercise per Session: 0 min  Stress: Stress Concern Present (04/16/2023)   [employer] of Occupational Health - Occupational Stress Questionnaire    Feeling of Stress : Rather much  Social Connections: Socially Isolated (04/16/2023)   Social Connection and Isolation Panel    Frequency of Communication with Friends and Family: Never    Frequency of Social Gatherings with Friends and Family: Never    Attends Religious Services: Never    Database Administrator or Organizations: No    Attends Banker Meetings: Never    Marital Status: Married    Allergies:  Allergies  Allergen Reactions   Latex Anaphylaxis and Hives    Metabolic Disorder Labs: No results found for: HGBA1C, MPG No results found for: PROLACTIN No results found for: CHOL, TRIG, HDL, CHOLHDL, VLDL, LDLCALC Lab Results  Component Value Date   TSH 1.620 11/26/2023   TSH 2.710 03/04/2016    Therapeutic Level Labs: No results found for: LITHIUM No results found for: VALPROATE No results found for: CBMZ  Current Medications: Current Outpatient Medications  Medication Sig Dispense Refill   escitalopram  (LEXAPRO ) 10 MG tablet Take 1 tablet (10 mg total) by mouth daily. 30 tablet 1   escitalopram  (LEXAPRO ) 5 MG tablet Patient to take Escitalopram  5 mg daily for 6 days then continue taking 10 mg daily. 6 tablet 0   amLODipine  (NORVASC ) 5 MG tablet Take 1 tablet (5 mg total) by mouth daily. 30 tablet 1   amphetamine -dextroamphetamine  (ADDERALL XR) 20 MG 24 hr capsule Take 1 capsule (20 mg total) by mouth every morning. 30 capsule 0   amphetamine -dextroamphetamine  (ADDERALL) 10 MG tablet Take 1 tablet (10 mg total) by mouth daily with breakfast. 30 tablet 0   doxycycline  (VIBRA -TABS) 100 MG tablet Take 1 tablet by mouth twice daily for 7 days. (Patient not taking: Reported on 01/01/2024) 14 tablet 0   Ferrous Sulfate  (IRON ) 325 (65 Fe) MG TABS Take 1 tablet (325 mg total) by  mouth daily. 30 tablet 0   fluconazole  (DIFLUCAN ) 150 MG tablet Take 1 tablet (150 mg total) by mouth daily. 1 tablet 0   FLUoxetine  (PROZAC ) 10 MG capsule Patient to take fluoxetine  10 mg daily for seven days before discontinuing. 7 capsule 0   FLUoxetine  (PROZAC ) 40 MG capsule  Take 1 capsule (40 mg total) by mouth daily. 30 capsule 1   Vitamin D , Ergocalciferol , (DRISDOL ) 1.25 MG (50000 UNIT) CAPS capsule Take 1 capsule (50,000 Units total) by mouth every 7 (seven) days. 5 capsule 2   No current facility-administered medications for this visit.     Musculoskeletal: Strength & Muscle Tone: within normal limits Gait & Station: normal Patient leans: N/A  Psychiatric Specialty Exam: Review of Systems  Psychiatric/Behavioral:  Positive for dysphoric mood and sleep disturbance. Negative for decreased concentration, hallucinations, self-injury and suicidal ideas. The patient is nervous/anxious. The patient is not hyperactive.     There were no vitals taken for this visit.There is no height or weight on file to calculate BMI.  General Appearance: Casual  Eye Contact:  Good  Speech:  Clear and Coherent and Normal Rate  Volume:  Normal  Mood:  Anxious and Depressed  Affect:  Congruent  Thought Process:  Coherent, Goal Directed, and Descriptions of Associations: Intact  Orientation:  Full (Time, Place, and Person)  Thought Content: WDL   Suicidal Thoughts:  No  Homicidal Thoughts:  No  Memory:  Immediate;   Good Recent;   Good Remote;   Good  Judgement:  Good  Insight:  Good  Psychomotor Activity:  Normal  Concentration:  Concentration: Good and Attention Span: Good  Recall:  Good  Fund of Knowledge: Good  Language: Good  Akathisia:  No  Handed:  Right  AIMS (if indicated): not done  Assets:  Communication Skills Desire for Improvement Financial Resources/Insurance Social Support Transportation Vocational/Educational  ADL's:  Intact  Cognition: WNL  Sleep:  Fair    Screenings: GAD-7    Flowsheet Row Video Visit from 02/03/2024 in [facility] Most recent reading at 02/03/2024  4:17 PM Office Visit from 01/01/2024 in [facility] Most recent reading at 01/01/2024  9:01 AM Video Visit from 12/09/2023 in [facility] Most recent reading at 12/09/2023 10:39 AM Office Visit from 11/26/2023 in [facility] Most recent reading at 11/26/2023  5:18 PM Office Visit from 11/26/2023 in [facility] Most recent reading at 11/26/2023  9:20 AM  Total GAD-7 Score 20 21 20 19 17    PHQ2-9    Flowsheet Row Video Visit from 02/03/2024 in [facility] Most recent reading at 02/03/2024  4:14 PM Office Visit from 01/01/2024 in [facility] Most recent reading at 01/01/2024  9:00 AM Video Visit from 12/09/2023 in [facility] Most recent reading at 12/09/2023 10:39 AM Office Visit from 11/26/2023 in [facility] Most recent reading at 11/26/2023  5:17 PM Office Visit from 11/26/2023 in [facility] Most recent reading at 11/26/2023  9:18 AM  PHQ-2 Total Score 6 4 4 4 5   PHQ-9 Total Score 23 16 18 20 18    Flowsheet Row Video Visit from 02/03/2024 in [facility] Office Visit from 11/26/2023 in [facility] from 11/25/2023 in [facility]  C-SSRS RISK CATEGORY Moderate Risk Moderate Risk Error: Question 6 not populated     Assessment and Plan:   Jessica Massey is a 33 year old female with a past psychiatric history significant for major depressive disorder (recurrent episode, moderate), attention deficit hyperactivity disorder (unspecified ADHD type), generalized anxiety  disorder, and PTSD who presents to [facility] for follow-up and medication management.  Patient presents to the encounter stating that she continues to take her medications regularly but states that her use of Prozac  has made her feel on edge and more snappy.  She reports that her use of Adderall continues to go well and denies experiencing any adverse side effects.  Patient continues to endorse worsening depression and anxiety attributed to several stressors in her life.  A PHQ-9 screen was performed with the patient scoring a 23.  A GAD-7 screening was also performed with patient scoring a 20.  Patient would like to discontinue her use of Prozac  due to the perceived side effects she has been experiencing from the medication.  Provider instructed patient to take Prozac  20 mg for a week before discontinuing the medication.  Patient was instructed to start escitalopram  5 mg for 6 days, followed by 10 mg daily for the management of her depressive symptoms and anxiety.  Patient was agreeable to starting escitalopram  and verbalized understanding.  Patient's medications to be e-prescribed to her pharmacy of choice.  Patient's urine drug screen is up-to-date.  A [place] Suicide Severity Rating Scale was performed with the patient being considered moderate risk.  Patient denies suicidal ideations and is able to contract for safety at this time.  Safety planning was discussed with the patient prior to the conclusion of the encounter.             - Patient was instructed to contact 911 in the event of a mental health crisis. - Patient was instructed to contact [facility] in the event of a mental health crisis. - Patient was instructed to present to [facility] in the event of a mental health crisis.  Collaboration of Care: Collaboration of Care: Medication Management AEB provider managing patient's psychiatric medications, Primary Care Provider AEB provider being seen by an  internal medicine provider, Psychiatrist AEB patient being followed by a mental health provider at this facility, and Referral or follow-up with counselor/therapist AEB patient being seen by a licensed clinical social worker at this facility.  Patient/Guardian was advised Release of Information must be obtained prior to any record release in order to collaborate their care with an outside provider. Patient/Guardian was advised if they have not already done so to contact the registration department to sign all necessary forms in order for us  to release information regarding their care.   Consent: Patient/Guardian gives verbal consent for treatment and assignment of benefits for services provided during this visit. Patient/Guardian expressed understanding and agreed to proceed.   1. Major depressive disorder, recurrent episode, moderate (HCC)  - FLUoxetine  (PROZAC ) 10 MG capsule; Patient to take fluoxetine  10 mg daily for seven days before discontinuing.  Dispense: 7 capsule; Refill: 0 - escitalopram  (LEXAPRO ) 5 MG tablet; Patient to take Escitalopram  5 mg daily for 6 days then continue taking 10 mg daily.  Dispense: 6 tablet; Refill: 0 - escitalopram  (LEXAPRO ) 10 MG tablet; Take 1 tablet (10 mg total) by mouth daily.  Dispense: 30 tablet; Refill: 1  2. Generalized anxiety disorder  - FLUoxetine  (PROZAC ) 10 MG capsule; Patient to take fluoxetine  10 mg daily for seven days before discontinuing.  Dispense: 7 capsule; Refill: 0 - escitalopram  (LEXAPRO ) 5 MG tablet; Patient to take Escitalopram  5 mg daily for 6 days then continue taking 10 mg daily.  Dispense: 6 tablet; Refill: 0 - escitalopram  (LEXAPRO )  10 MG tablet; Take 1 tablet (10 mg total) by mouth daily.  Dispense: 30 tablet; Refill: 1  3. PTSD (post-traumatic stress disorder)  - FLUoxetine  (PROZAC ) 10 MG capsule; Patient to take fluoxetine  10 mg daily for seven days before discontinuing.  Dispense: 7 capsule; Refill: 0 - escitalopram  (LEXAPRO )  5 MG tablet; Patient to take Escitalopram  5 mg daily for 6 days then continue taking 10 mg daily.  Dispense: 6 tablet; Refill: 0 - escitalopram  (LEXAPRO ) 10 MG tablet; Take 1 tablet (10 mg total) by mouth daily.  Dispense: 30 tablet; Refill: 1  4. Attention deficit hyperactivity disorder (ADHD), unspecified ADHD type (Primary) Patient to continue taking Adderall 20 mg every morning for the management of her attention deficit hyperactivity disorder Patient to continue taking Adderall 10 mg daily with breakfast for the management of her attention deficit hyperactivity disorder  Patient to follow up in 6 weeks Provider spent a total of 22 minutes with the patient/reviewing the patient's chart  Reginia FORBES Bolster, PA 02/03/2024, 7:00 PM

## 2024-02-04 ENCOUNTER — Other Ambulatory Visit: Payer: Self-pay

## 2024-02-13 ENCOUNTER — Other Ambulatory Visit: Payer: Self-pay

## 2024-02-24 ENCOUNTER — Other Ambulatory Visit: Payer: Self-pay

## 2024-03-16 ENCOUNTER — Telehealth (HOSPITAL_COMMUNITY): Admitting: Physician Assistant

## 2024-03-16 ENCOUNTER — Encounter (HOSPITAL_COMMUNITY): Payer: Self-pay | Admitting: Physician Assistant

## 2024-03-16 ENCOUNTER — Other Ambulatory Visit: Payer: Self-pay

## 2024-03-16 DIAGNOSIS — F331 Major depressive disorder, recurrent, moderate: Secondary | ICD-10-CM

## 2024-03-16 DIAGNOSIS — F4310 Post-traumatic stress disorder, unspecified: Secondary | ICD-10-CM

## 2024-03-16 DIAGNOSIS — F411 Generalized anxiety disorder: Secondary | ICD-10-CM | POA: Diagnosis not present

## 2024-03-16 DIAGNOSIS — F909 Attention-deficit hyperactivity disorder, unspecified type: Secondary | ICD-10-CM

## 2024-03-16 MED ORDER — ESCITALOPRAM OXALATE 5 MG PO TABS
15.0000 mg | ORAL_TABLET | Freq: Every day | ORAL | 1 refills | Status: AC
  Filled 2024-03-16: qty 90, 30d supply, fill #0

## 2024-03-16 MED ORDER — AMPHETAMINE-DEXTROAMPHETAMINE 10 MG PO TABS
10.0000 mg | ORAL_TABLET | Freq: Every day | ORAL | 0 refills | Status: AC
  Filled 2024-03-16: qty 30, 30d supply, fill #0

## 2024-03-16 MED ORDER — AMPHETAMINE-DEXTROAMPHET ER 20 MG PO CP24
20.0000 mg | ORAL_CAPSULE | Freq: Every morning | ORAL | 0 refills | Status: AC
  Filled 2024-03-16: qty 30, 30d supply, fill #0

## 2024-03-16 NOTE — Progress Notes (Unsigned)
 [facility] MD/PA/NP OP Progress Note  Virtual Visit via Video Note  I connected with Jessica Massey on 03/16/24 at  4:30 PM EST by a video enabled telemedicine application and verified that I am speaking with the correct person using two identifiers.  Location: Patient: Home Provider: Clinic   I discussed the limitations of evaluation and management by telemedicine and the availability of in person appointments. The patient expressed understanding and agreed to proceed.  Follow Up Instructions:  I discussed the assessment and treatment plan with the patient. The patient was provided an opportunity to ask questions and all were answered. The patient agreed with the plan and demonstrated an understanding of the instructions.   The patient was advised to call back or seek an in-person evaluation if the symptoms worsen or if the condition fails to improve as anticipated.  I provided 18 minutes of non-face-to-face time during this encounter.  Jessica FORBES Bolster, PA    03/16/2024 5:30 PM Jessica Massey  MRN:  [id number]  Chief Complaint:  Chief Complaint  Patient presents with   Follow-up   Medication Refill   HPI:   Jessica Massey. Yaun is a 34 year old female with a past psychiatric history significant for major depressive disorder (recurrent episode, moderate), attention deficit hyperactivity disorder (unspecified ADHD type), generalized anxiety disorder, and PTSD who presents to [facility] for follow-up and medication management.  Patient is currently being managed on the following psychiatric medications:  Adderall XR 20 mg daily Adderall 10 mg daily Lexapro  10 mg daily  Patient reports that since being placed on Lexapro , the medication has been helping out with managing her depressive symptoms and anxiety.  Patient denies experiencing any adverse side effects associated with the use of her Lexapro .  Patient reports that her depression is not  as impactful as it was before.  She also reports that her anxiety has improved a little.  She still continues to express anxiety attributed to fear that someone is out to get her.  She also reports that she is constantly looking over her shoulders.  Patient endorses depression and rates her depression a 5 out of 10 with 10 being most severe.  Patient endorses depressive episode 2 to 3 days/week.  Patient endorses the following depressive symptoms: feelings of sadness (improving), lack of motivation (improving), and decreased energy.  Patient denies decreased concentration, irritability, feelings of guilt/worthlessness, or hopelessness.  Patient denies any new stressors at this time.  A PHQ-9 screen was performed with the patient scoring an 18.  A GAD-7 screen was also performed with the patient scoring a 16.  Patient reports that her use of Adderall has been going well.  She reports that her Adderall XR 20 mg daily does not last the whole day and typically takes the medication between 6 AM and 7 AM.  She reports that she starts losing focus and concentration by 1 PM which is when she takes her 10 mg of Adderall.  Patient denies experiencing any adverse side effects associated with her use of Adderall.  Patient is alert and oriented x 4, calm, cooperative, and fully engaged in conversation during the encounter.  Patient describes her mood as easily annoyed and irritable.  Patient exhibits depressed and anxious mood with congruent affect.  Patient denies suicidal or homicidal ideations.  She further denies auditory or visual hallucinations and does not appear to be responding to internal/external stimuli.  Patient endorses fair sleep and receives on average 4 to 6  hours of sleep per night.  Patient endorses fair appetite and eats on average 2 meals per day.  Patient endorses alcohol consumption on occasion and has roughly 3 drinks per week.  Patient denies tobacco use but does engage in vaping.  Patient denies  illicit drug use.  Visit Diagnosis:    ICD-10-CM   1. Major depressive disorder, recurrent episode, moderate (HCC)  F33.1 escitalopram  (LEXAPRO ) 5 MG tablet    2. Generalized anxiety disorder  F41.1 escitalopram  (LEXAPRO ) 5 MG tablet    3. PTSD (post-traumatic stress disorder)  F43.10 escitalopram  (LEXAPRO ) 5 MG tablet    4. Attention deficit hyperactivity disorder (ADHD), unspecified ADHD type  F90.9 amphetamine -dextroamphetamine  (ADDERALL XR) 20 MG 24 hr capsule    amphetamine -dextroamphetamine  (ADDERALL) 10 MG tablet    Urine Drug Panel 7      Past Psychiatric History:  Patient has a past psychiatric history significant for ADHD, generalized anxiety disorder, PTSD, and major depressive disorder.   Patient denies a past history of hospitalization due to mental health.   Patient endorses a past history of suicide attempt stating that she tried to hang herself 8 to 9 years ago.   Patient denies a past history of homicide attempts.  Past Medical History:  Past Medical History:  Diagnosis Date   Constipation    Depression    Dyspnea    with exertion   Gestational diabetes    metformin   Headache    migraine-    Optic disc edema    Pseudotumor cerebri 01/16/2016    Past Surgical History:  Procedure Laterality Date   APPENDECTOMY     TOOTH EXTRACTION N/A 02/28/2017   Procedure: DENTAL EXTRACTIONS IMPACTED TEETH NUMBER SIXTEEN, SEVENTEEN, THIRTY-TWO;  Surgeon: Sheryle Hamilton, DDS;  Location: [facility] OR;  Service: Oral Surgery;  Laterality: N/A;    Family Psychiatric History:  Mother - depression, patient reports that her mother is currently on Prozac .  She reports that she has obtained a few Prozac  pills from her mother when she has run out of her prescription.   Family history of suicide attempt: Patient denies Family history of homicide attempt: Patient denies Family history of substance abuse: Patient reports that her brother abuses alcohol, marijuana, and cocaine  Family  History:  Family History  Problem Relation Age of Onset   Hypertension Mother    Diabetes Mother    Aneurysm Father    Breast cancer Paternal Aunt    Breast cancer Maternal Grandmother    Asthma Brother    Spina bifida Brother    High blood pressure Other     Social History:  Social History   Socioeconomic History   Marital status: Single    Spouse name: Not on file   Number of children: 2   Years of education: 8th   Highest education level: Not on file  Occupational History    Employer: [employer]  Tobacco Use   Smoking status: Former    Current packs/day: 0.00    Average packs/day: 0.3 packs/day    Types: Cigarettes    Quit date: 12/2016    Years since quitting: 7.2   Smokeless tobacco: Never   Tobacco comments:    10  Vaping Use   Vaping status: Never Used  Substance and Sexual Activity   Alcohol use: Not Currently    Alcohol/week: 1.0 standard drink of alcohol    Types: 1 Shots of liquor per week    Comment: occasional social- 2 drinks  2 times  a month- 02/27/17   Drug use: No   Sexual activity: Yes    Birth control/protection: None  Other Topics Concern   Not on file  Social History Narrative   Patient lives at home with her family.   Patient works at [employer].   Patient is right handed.   Caffeine - 32oz per day.   Social Drivers of Health   Tobacco Use: Medium Risk (03/16/2024)   Patient History    Smoking Tobacco Use: Former    Smokeless Tobacco Use: Never    Passive Exposure: Not on file  Financial Resource Strain: Low Risk (04/16/2023)   Overall Financial Resource Strain (CARDIA)    Difficulty of Paying Living Expenses: Not very hard  Food Insecurity: No Food Insecurity (04/16/2023)   Hunger Vital Sign    Worried About Running Out of Food in the Last Year: Never true    Ran Out of Food in the Last Year: Never true  Transportation Needs: No Transportation Needs (04/16/2023)   PRAPARE - Administrator, Civil Service  (Medical): No    Lack of Transportation (Non-Medical): No  Physical Activity: Inactive (04/16/2023)   Exercise Vital Sign    Days of Exercise per Week: 2 days    Minutes of Exercise per Session: 0 min  Stress: Stress Concern Present (04/16/2023)   [employer] of Occupational Health - Occupational Stress Questionnaire    Feeling of Stress : Rather much  Social Connections: Socially Isolated (04/16/2023)   Social Connection and Isolation Panel    Frequency of Communication with Friends and Family: Never    Frequency of Social Gatherings with Friends and Family: Never    Attends Religious Services: Never    Database Administrator or Organizations: No    Attends Banker Meetings: Never    Marital Status: Married  Depression (PHQ2-9): High Risk (03/16/2024)   Depression (PHQ2-9)    PHQ-2 Score: 18  Alcohol Screen: Low Risk (04/16/2023)   Alcohol Screen    Last Alcohol Screening Score (AUDIT): 4  Housing: Low Risk (04/16/2023)   Housing Stability Vital Sign    Unable to Pay for Housing in the Last Year: No    Number of Times Moved in the Last Year: 0    Homeless in the Last Year: No  Utilities: Not At Risk (04/16/2023)   AHC Utilities    Threatened with loss of utilities: No  Health Literacy: Adequate Health Literacy (04/16/2023)   B1300 Health Literacy    Frequency of need for help with medical instructions: Never    Allergies:  Allergies  Allergen Reactions   Latex Anaphylaxis and Hives    Metabolic Disorder Labs: No results found for: HGBA1C, MPG No results found for: PROLACTIN No results found for: CHOL, TRIG, HDL, CHOLHDL, VLDL, LDLCALC Lab Results  Component Value Date   TSH 1.620 11/26/2023   TSH 2.710 03/04/2016    Therapeutic Level Labs: No results found for: LITHIUM No results found for: VALPROATE No results found for: CBMZ  Current Medications: Current Outpatient Medications  Medication Sig Dispense Refill    amLODipine  (NORVASC ) 5 MG tablet Take 1 tablet (5 mg total) by mouth daily. 30 tablet 1   amphetamine -dextroamphetamine  (ADDERALL XR) 20 MG 24 hr capsule Take 1 capsule (20 mg total) by mouth every morning. 30 capsule 0   amphetamine -dextroamphetamine  (ADDERALL) 10 MG tablet Take 1 tablet (10 mg total) by mouth daily in the afternoon. For breakthrough symptoms 30 tablet 0  doxycycline  (VIBRA -TABS) 100 MG tablet Take 1 tablet by mouth twice daily for 7 days. (Patient not taking: Reported on 01/01/2024) 14 tablet 0   escitalopram  (LEXAPRO ) 5 MG tablet Take 1 tablet (5 mg total) by mouth daily, then start 10mg  daily. 6 tablet 0   escitalopram  (LEXAPRO ) 5 MG tablet Take 3 tablets (15 mg total) by mouth daily. 90 tablet 1   Ferrous Sulfate  (IRON ) 325 (65 Fe) MG TABS Take 1 tablet (325 mg total) by mouth daily. 30 tablet 0   fluconazole  (DIFLUCAN ) 150 MG tablet Take 1 tablet (150 mg total) by mouth daily. 1 tablet 0   Vitamin D , Ergocalciferol , (DRISDOL ) 1.25 MG (50000 UNIT) CAPS capsule Take 1 capsule (50,000 Units total) by mouth every 7 (seven) days. 5 capsule 2   No current facility-administered medications for this visit.     Musculoskeletal: Strength & Muscle Tone: within normal limits Gait & Station: normal Patient leans: N/A  Psychiatric Specialty Exam: Review of Systems  Psychiatric/Behavioral:  Positive for dysphoric mood and sleep disturbance. Negative for decreased concentration, hallucinations, self-injury and suicidal ideas. The patient is nervous/anxious. The patient is not hyperactive.     There were no vitals taken for this visit.There is no height or weight on file to calculate BMI.  General Appearance: Casual  Eye Contact:  Good  Speech:  Clear and Coherent and Normal Rate  Volume:  Normal  Mood:  Anxious and Depressed  Affect:  Congruent  Thought Process:  Coherent, Goal Directed, and Descriptions of Associations: Intact  Orientation:  Full (Time, Place, and Person)   Thought Content: WDL   Suicidal Thoughts:  No  Homicidal Thoughts:  No  Memory:  Immediate;   Good Recent;   Good Remote;   Good  Judgement:  Good  Insight:  Good  Psychomotor Activity:  Normal  Concentration:  Concentration: Good and Attention Span: Good  Recall:  Good  Fund of Knowledge: Good  Language: Good  Akathisia:  No  Handed:  Right  AIMS (if indicated): not done  Assets:  Communication Skills Desire for Improvement Financial Resources/Insurance Social Support Transportation Vocational/Educational  ADL's:  Intact  Cognition: WNL  Sleep:  Fair   Screenings: GAD-7    Flowsheet Row Video Visit from 03/16/2024 in [facility] Video Visit from 02/03/2024 in [facility] Office Visit from 01/01/2024 in [facility] Video Visit from 12/09/2023 in [facility] Office Visit from 11/26/2023 in [facility]  Total GAD-7 Score 16 20 21 20 19    PHQ2-9    Flowsheet Row Video Visit from 03/16/2024 in [facility] Video Visit from 02/03/2024 in [facility] Office Visit from 01/01/2024 in [facility] Video Visit from 12/09/2023 in [facility] Office Visit from 11/26/2023 in [facility]  PHQ-2 Total Score 3 6 4 4 4   PHQ-9 Total Score 18 23 16 18 20    Flowsheet Row [facility] Visit from 02/03/2024 in [facility] Office Visit from 11/26/2023 in [facility]  C-SSRS RISK CATEGORY Moderate Risk Moderate Risk Moderate Risk     Assessment and Plan:   Jenyfer Trawick. Madaris is a 34 year old female with a past psychiatric history significant for  major depressive disorder (recurrent episode, moderate), attention deficit  hyperactivity disorder (unspecified ADHD type), generalized anxiety disorder, and PTSD who presents to [facility] for follow-up and medication management.  Patient presents to the encounter stating that she continues to take her medications as prescribed and denies experiencing any adverse side effects.  In regards to her use of Adderall, patient reports that her Adderall XR 20 mg daily typically last from 6 AM to 1 PM.  She reports that she takes her additional Adderall 10 mg daily at 1 PM for the management of her breakthrough symptoms.  Patient denies experiencing any adverse side effects associated with the use of her Adderall.  Since the addition of Lexapro , patient reports that her depressive symptoms and anxiety have been less impactful.  She still continues to experience depressive episodes anxiety characterized by fear that someone is out to get her.  A PHQ-9 screening was performed patient scoring an 18.  A GAD-7 screen was also performed with the patient scoring a 16.  Provider recommended adjusting patient's Lexapro  dosage from 10 mg to 15 mg daily for the management of her depressive symptoms and anxiety.  Patient was agreeable to recommendation.  Patient to continue taking her Adderall as prescribed.  Patient's medications to be e-prescribed to pharmacy of choice.  Provider informed patient that a urine drug screen would need to be obtained for her to continue receiving her Adderall.  Patient vocalized understanding.  A [place] Suicide Severity Rating Scale was performed with the patient being considered moderate risk.  Patient denies suicidal ideations and is able to contract for safety at this time.  Safety planning was discussed with the patient prior to the conclusion of the encounter.             - Patient was instructed to contact 911 in the event of a mental health crisis. - Patient was instructed to contact [facility] Su[facility] and Crisis [facility]eline in  the event of a mental health crisis. - Patient was instructed to present to [facility] in the event of a mental health crisis.  Collaboration of Care: Collaboration of Care: Medication Management AEB provider managing patient's psychiatric medications, Primary Care Provider AEB provider being seen by an internal medicine provider, Psychiatrist AEB patient being followed by a mental health provider at this facility, and Referral or follow-up with counselor/therapist AEB patient being seen by a licensed clinical social worker at this facility.  Patient/Guardian was advised Release of Information must be obtained prior to any record release in order to collaborate their care with an outside provider. Patient/Guardian was advised if they have not already done so to contact the registration department to sign all necessary forms in order for us  to release information regarding their care.   Consent: Patient/Guardian gives verbal consent for treatment and assignment of benefits for services provided during this visit. Patient/Guardian expressed understanding and agreed to proceed.   1. Major depressive disorder, recurrent episode, moderate (HCC)  - escitalopram  (LEXAPRO ) 5 MG tablet; Take 3 tablets (15 mg total) by mouth daily.  Dispense: 90 tablet; Refill: 1  2. Generalized anxiety disorder  - escitalopram  (LEXAPRO ) 5 MG tablet; Take 3 tablets (15 mg total) by mouth daily.  Dispense: 90 tablet; Refill: 1  3. PTSD (post-traumatic stress disorder)  - escitalopram  (LEXAPRO ) 5 MG tablet; Take 3 tablets (15 mg total) by mouth daily.  Dispense: 90 tablet; Refill: 1  4. Attention deficit hyperactivity disorder (ADHD), unspecified  ADHD type  - amphetamine -dextroamphetamine  (ADDERALL XR) 20 MG 24 hr capsule; Take 1 capsule (20 mg total) by mouth every morning.  Dispense: 30 capsule; Refill: 0 - amphetamine -dextroamphetamine  (ADDERALL) 10 MG tablet; Take 1 tablet (10 mg  total) by mouth daily in the afternoon. For breakthrough symptoms  Dispense: 30 tablet; Refill: 0 - Urine Drug Panel 7; Future  Patient to follow up in 6 weeks Provider spent a total of 18 minutes with the patient/reviewing the patient's chart  Jessica FORBES Bolster, PA 03/16/2024, 5:30 PM

## 2024-03-17 ENCOUNTER — Other Ambulatory Visit: Payer: Self-pay

## 2024-03-19 ENCOUNTER — Other Ambulatory Visit (HOSPITAL_COMMUNITY): Payer: Self-pay

## 2024-03-19 ENCOUNTER — Other Ambulatory Visit: Payer: Self-pay

## 2024-04-01 ENCOUNTER — Encounter: Payer: Self-pay | Admitting: Nurse Practitioner

## 2024-04-01 ENCOUNTER — Ambulatory Visit: Payer: Self-pay | Admitting: Nurse Practitioner

## 2024-04-02 ENCOUNTER — Ambulatory Visit: Payer: Self-pay | Admitting: Nurse Practitioner

## 2024-04-27 ENCOUNTER — Telehealth (HOSPITAL_COMMUNITY): Admitting: Physician Assistant
